# Patient Record
Sex: Female | Born: 1990 | Hispanic: Yes | Marital: Single | Smoking: Never smoker
Health system: Southern US, Community
[De-identification: ages and names within clinical notes are randomized; demographics above are authoritative.]

## PROBLEM LIST (undated history)

## (undated) DIAGNOSIS — D693 Immune thrombocytopenic purpura: Secondary | ICD-10-CM

## (undated) DIAGNOSIS — D649 Anemia, unspecified: Secondary | ICD-10-CM

## (undated) HISTORY — DX: Immune thrombocytopenic purpura: D69.3

## (undated) HISTORY — PX: SPLENECTOMY, TOTAL: SHX788

## (undated) HISTORY — DX: Anemia, unspecified: D64.9

---
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
## 2012-04-09 ENCOUNTER — Emergency Department: Payer: BC Managed Care – PPO

## 2012-04-09 ENCOUNTER — Emergency Department
Admission: EM | Admit: 2012-04-09 | Discharge: 2012-04-09 | Disposition: A | Discharge status: Home / Self Care | Payer: BC Managed Care – PPO | Attending: Emergency Medicine | Admitting: Emergency Medicine

## 2012-04-09 DIAGNOSIS — Z862 Personal history of diseases of the blood and blood-forming organs and certain disorders involving the immune mechanism: Secondary | ICD-10-CM | POA: Insufficient documentation

## 2012-04-09 DIAGNOSIS — J020 Streptococcal pharyngitis: Secondary | ICD-10-CM

## 2012-04-09 LAB — GROUP A STREP, RAPID ANTIGEN: Group A Strep, Rapid Antigen: POSITIVE — AB

## 2012-04-09 MED ORDER — DEXAMETHASONE SODIUM PHOSPHATE 4 MG/ML IJ SOLN
10.00 mg | Freq: Once | INTRAMUSCULAR | Status: DC
Start: 2012-04-09 — End: 2012-04-09

## 2012-04-09 MED ORDER — DEXAMETHASONE SODIUM PHOSPHATE 4 MG/ML IJ SOLN
10.00 mg | Freq: Once | INTRAMUSCULAR | Status: AC
Start: 2012-04-09 — End: 2012-04-09
  Administered 2012-04-09: 10 mg via INTRAMUSCULAR

## 2012-04-09 MED ORDER — DEXAMETHASONE SODIUM PHOSPHATE 10 MG/ML IJ SOLN
INTRAMUSCULAR | Status: DC
Start: 2012-04-09 — End: 2012-04-09
  Filled 2012-04-09: qty 1

## 2012-04-09 MED ORDER — PENICILLIN G BENZATHINE 1200000 UNIT/2ML IM SUSP
1.20 10*6.[IU] | Freq: Once | INTRAMUSCULAR | Status: AC
Start: 2012-04-09 — End: 2012-04-09
  Administered 2012-04-09: 1.2 10*6.[IU] via INTRAMUSCULAR
  Filled 2012-04-09: qty 2

## 2012-04-09 MED ORDER — LIDOCAINE HCL 1 % IJ SOLN
INTRAMUSCULAR | Status: DC
Start: 2012-04-09 — End: 2012-04-09
  Filled 2012-04-09: qty 1

## 2012-04-09 NOTE — ED Notes (Addendum)
Pt arrives ambulatory through triage via private vehicle w/ c/o sore throat x 2 days. Pt states yesterday PM began having chills/fever, last took tylenol and motrin together @ 0900, afebrile in triage. Pt states pain in throat is worse w/ swallowing/talking. Pt states is able to tolerate PO food/fluids but "it hurts to swallow". Pt states was baby-sitting nieces 5 days ago, states nieces recently diagnosed w/ scarlet fever and strep throat. Strep swab obtained in triage and sent to lab.

## 2012-04-09 NOTE — ED Notes (Signed)
Pt tolerated IM medication well, reassessed 15 mins post-administration, no s/s of med reaction, VSS. Pt condition favorable for discharge, resp remain even & unlabored, ambulatory w/ steady gait, in NAD. Verbal and written discharge instructions reviewed w/ pt. Patient informed of signs and symptoms that warrant seeking further evaluation/treatment. Pt states has no further questions. Pt father present at time of dispo.

## 2012-04-09 NOTE — ED Provider Notes (Signed)
Physician/Midlevel provider first contact with patient: 04/09/12 1006         c/o sore throat x 2 days. Pt states yesterday PM began having chills/fever, last took tylenol and motrin together @ 0900, afebrile in triage. Pt states pain in throat is worse w/ swallowing/talking. Pt states is able to tolerate PO food/fluids but "it hurts to swallow". Pt states was baby-sitting nieces 5 days ago, states nieces recently diagnosed w/ scarlet fever and strep throat .    History     Chief Complaint   Patient presents with   . Sore Throat     Patient is a 21 y.o. female presenting with pharyngitis. The history is provided by the patient.   Sore Throat  This is a new problem. The current episode started yesterday. Associated symptoms include abdominal pain, a fever, nausea and vomiting. Pertinent negatives include no arthralgias, chest pain, chills, congestion, coughing, headaches, myalgias, neck pain, numbness, rash, sore throat or weakness. Nothing aggravates the symptoms. She has tried nothing for the symptoms.       Past Medical History   Diagnosis Date   . Anemia      low platelet count r/t splenectomy       Past Surgical History   Procedure Date   . Splenectomy, total        No family history on file.    Social  History   Substance Use Topics   . Smoking status: Never Smoker    . Smokeless tobacco: Not on file   . Alcohol Use: No       .     No Known Allergies    Current/Home Medications    PENICILLIN V POTASSIUM PO    Take by mouth.        Review of Systems   Constitutional: Positive for fever. Negative for chills.   HENT: Negative for ear pain, congestion, sore throat, neck pain, dental problem and voice change.    Eyes: Negative for pain, discharge and visual disturbance.   Respiratory: Negative for cough, chest tightness and shortness of breath.    Cardiovascular: Negative for chest pain and palpitations.   Gastrointestinal: Positive for nausea, vomiting and abdominal pain. Negative for diarrhea.   Genitourinary:  Negative for frequency, flank pain and difficulty urinating.   Musculoskeletal: Negative for myalgias, back pain and arthralgias.   Skin: Negative for rash.   Neurological: Negative for dizziness, weakness, numbness and headaches.   Psychiatric/Behavioral: Negative for suicidal ideas, hallucinations, confusion, self-injury and agitation.       Physical Exam    BP 112/53  Pulse 60  Temp 98.1 F (36.7 C) (Oral)  Resp 18  Ht 1.626 m  Wt 49.896 kg  BMI 18.88 kg/m2  SpO2 98%    Physical Exam   Constitutional: She is oriented to person, place, and time. She appears well-developed and well-nourished. No distress.   HENT:   Head: Atraumatic.   Right Ear: External ear normal.   Left Ear: External ear normal.   Nose: Nose normal.   Mouth/Throat:       Eyes: EOM are normal. Pupils are equal, round, and reactive to light.   Neck: Neck supple. No tracheal deviation present. No thyromegaly present.   Cardiovascular: Normal rate, regular rhythm and normal heart sounds.    No murmur heard.  Pulmonary/Chest: No respiratory distress. She has no wheezes. She has no rales.   Abdominal: Soft. She exhibits no distension and no mass. There is no tenderness.  Musculoskeletal: Normal range of motion. She exhibits no edema and no tenderness.   Neurological: She is alert and oriented to person, place, and time. No cranial nerve deficit.   Skin: Skin is warm. No rash noted. No erythema.   Psychiatric: She has a normal mood and affect. Judgment normal.       MDM and ED Course     ED Medication Orders      Start     Status Ordering Provider    04/09/12 1130      Once,   Status:  Discontinued      Route: Other  Ordered Dose: 10 mg         Discontinued Aura Bibby    04/09/12 1130   penicillin G benzathine (BICILLIN-LA) injection 1.2 Million Units   Once      Route: Intramuscular  Ordered Dose: 1.2 Million Units         Last MAR action:  Given Saprina Chuong    04/09/12 1130   dexamethasone (DECADRON) injection 10 mg   Once      Route:  Intramuscular  Ordered Dose: 10 mg         Last MAR action:  Given Mana Morison    04/09/12 1115      Once,   Status:  Discontinued      Route: Intramuscular  Ordered Dose: 10 mg         Discontinued Steel Kerney    04/09/12 1107     Status:  Discontinued      Comments: Created by cabinet override        Discontinued Romyn Boswell    04/09/12 1100     Status:  Discontinued      Comments: Created by cabinet override        Discontinued Peyton Bottoms, Dwight Adamczak                 MDM      Procedures    Clinical Impression & Disposition     Clinical Impression  Final diagnoses:   Strep pharyngitis        ED Disposition     Discharge Rafaela H Trumpower discharge to home/self care.    Condition at discharge: Stable             New Prescriptions    No medications on file           Labs Reviewed   GROUP A STREP, RAPID ANTIGEN - Abnormal; Notable for the following:     Group A Strep, Rapid Antigen Positive (*)      All other components within normal limits   RAPID INFLUENZA A/B ANTIGENS    Narrative:     ORDER#: [id number]                                    ORDERED BY: Peyton Bottoms, Kamdin Follett  SOURCE: Nasal Aspirate                               COLLECTED:  04/09/12 10:36  ANTIBIOTICS AT COLL.:                                RECEIVED :  04/09/12 10:44  Influenza Rapid Antigen A&B  FINAL       04/09/12 11:02  04/09/12   Negative for Influenza A and B             Reference Range: Negative         Diagnostic Study Results     Labs -     Results     Procedure Component Value Units Date/Time    Rapid Influenza A/B Antigens [[id number]] Collected:04/09/12 1036    Specimen Information:Nasopharyngeal / Nasal Aspirate Updated:04/09/12 1102    Narrative:    ORDER#: [id number]                                    ORDERED BY: Peyton Bottoms, Keiri Solano  SOURCE: Nasal Aspirate                               COLLECTED:  04/09/12 10:36  ANTIBIOTICS AT COLL.:                                RECEIVED :  04/09/12 10:44  Influenza Rapid Antigen A&B                FINAL        04/09/12 11:02  04/09/12   Negative for Influenza A and B             Reference Range: Negative      Rapid Strep [[id number]]  (Abnormal) Collected:04/09/12 1025    Specimen Information:Throat Updated:04/09/12 1041     Group A Strep, Rapid Antigen Positive (A)           Radiologic Studies -   Radiology Results (24 Hour)     ** No Results found for the last 24 hours. **      .    Clinical Course in the Emergency Department            Medical Decision Making     Presumptive Diagnosis: strep pharyngitis    Treatment Plan: Abx therapy, referral to Infectious disease specialist   _______________________________  I am the first provider for this patient.  I reviewed the vital signs, nursing notes, past medical history, past surgical history, family history and social history.  Vital Signs - Patient Vitals for the past 12 hrs:   BP Temp Pulse Resp   04/09/12 1129 112/53 mmHg 98.1 F (36.7 C) 60  18    04/09/12 1017 113/61 mmHg 98.3 F (36.8 C) 79  18      Pulse Oximetry Analysis - Normal  Differential Diagnosis (not completely inclusive): strep. Viral URI,   Laboratory results reviewed by EDP: Yes       Re-evaluation: The child is tolerating po with no vomiting or diarrhea. The child is clinically improved and will be discharged to home for symptomatic care. Return precautions given. Patient to follow up with their pediatrician as instructed.   Bubba Camp, MD  11:34 AM    Return precautions given.   Due to asplenia, I recommended that she increase her usual dosage of daily penicillin (1000 mg) to 1500 mg and follow up with her pediatrician and infectious disease specialist . I gave her the PCN injection in the ED       Bubba Camp, MD  04/09/12 1136

## 2012-04-09 NOTE — ED Notes (Signed)
Pt AA&Ox4, answering questions appropriately, able to speak in complete sentences, speech clear, behavior appropriate for age, interacting w/ parent/caretaker and RN, resp even & unlabored, no accessory muscle use noted, MAE w/ full ROM, ambulatory w/ steady gait, in NAD. Pt reports tolerating PO fluids well at home.  Bed in lowest position, call bell w/i reach and explained to pt, room/floor free of debris/clutter. Pt denies further needs at this time. Will continue to monitor.

## 2012-06-16 NOTE — Discharge Summary (Unsigned)
DATE OF BIRTH:                        Nov 07, 1990            ADMISSION DATE:                     03/16/2003      DISCHARGE DATE:                     03/19/2003            ATTENDING PHYSICIAN:                  Lorrene Reid, MD            ADMITTING SURGEON:  Dr. Tonye Becket.            HISTORY:  The patient is a 21 year old female who had a laparoscopic      splenectomy done a couple of days ago.  She presented with a history of      abdominal pain, nausea and vomiting.  A diagnosis of pancreatitis was made      based on her lab findings.            HOSPITAL COURSE:   She was started on clear liquids and was also placed on      pain medication.  A couple of days into admission she was started on a      clear liquid diet, which she did well with.  The pain was adequately      well-controlled.  Abdomen was very tender when she presented and now is      much better.  CT scan done on admission showed evidence of postoperative      changes in the splenic bed, but there was no evidence of active bleeding,      no evidence of pus collection.            The patient was advanced to a regular diet.  She is being discharged home      today, 03/22/2003 with medications for pain and instructions on diet with      emphasis on low fat diet.  She will get a follow up appointment to see [place]      in the clinic in the next 2 weeks.                                    ___________________________________     Date Signed: __________      Lorrene Reid, MD  ([id number])            D: 03/22/2003 by Carney Corners, MD      T: 03/22/2003 by [id number] ([id number]) Dorris Carnes: [id number])      cc:  Lorrene Reid, MD

## 2012-06-16 NOTE — Discharge Summary (Unsigned)
DATE OF BIRTH:                        1991-01-06            ADMISSION DATE:                     03/10/2003      DISCHARGE DATE:                     03/12/2003            ATTENDING PHYSICIAN:                  Lorrene Reid, MD            HOSPITAL COURSE:   Grace Mccarthy is a 21 year old female with a history of      idiopathic thrombocytopenic purpura. She had a laparoscopic splenectomy on      03/10/2003.  The procedure went well.  The patient was transferred up to      the floor and started on antibiotics.  She tolerated a clear liquid diet.      She was advanced to a regular diet, which she tolerated well.  She      ambulated out of bed.  The wounds looked clean and dry.  The pain was      adequately well controlled.  She is being discharged home today,      03/12/2003, with medications for pain and antibiotics with a followup to      see [place] in the next 2 weeks, instructions on exercise and instructions on      wound care.                                    ___________________________________          Date Signed: __________      Lorrene Reid, MD  ([id number])            D: 03/12/2003 by Carney Corners, MD      T: 03/13/2003 by [id number] ([id number]) Dorris Carnes: [id number])      cc:  Lorrene Reid, MD

## 2012-06-16 NOTE — Op Note (Unsigned)
DATE OF BIRTH:                        May 09, 1991      ADMISSION DATE:                     03/10/2003            PATIENT LOCATION:                    [facility] 03/12/2003            DATE OF PROCEDURE:                   03/10/2003      SURGEON:                            Lorrene Reid, MD      ASSISTANT(S):                         Carney Corners, MD                  PREOPERATIVE DIAGNOSIS:  IMMUNE THROMBOCYTOPENIC PURPURA.            POSTOPERATIVE DIAGNOSIS:  IMMUNE THROMBOCYTOPENIC PURPURA.            PROCEDURE:  LAPAROSCOPIC SPLENECTOMY.            ANESTHESIA:  General.            INDICATIONS FOR PROCEDURE:  She is a 21 year old girl with ITP who has      failed medical management and now requires a splenectomy for hypersplenism.      Her family understands the rationale for laparoscopic splenectomy as well      as the risks, benefits and alternatives to the procedure, but they are      eager to proceed..            DESCRIPTION OF PROCEDURE:  She was taken to the operating room and placed      in supine position.  After induction of adequate general endotracheal      anesthesia and after administration of intravenous antibiotics, she was      placed in the left anterior oblique position with appropriate padding.  Her      abdomen and left flank were prepped and draped in the usual sterile      fashion.  A 12-mm laparoscopic port was inserted under direct visualization      at the umbilicus.  Pneumoperitoneum was established and 2 further 5-mm      ports were placed in the upper abdominal midline and left flank.      Instruments inserted through these ports were used to pull away the omentum      from the left upper quadrant.  She was noted to have a small accessory      spleen in the gastrosplenic ligament and this was removed using Harmonic      scalpel and sent for pathologic examination.  The Harmonic scalpel was also      used to dissect free multiple lateral and inferior attachments to the      spleen.  The  short gastric vessels were taken down in a similar fashion.      One short gastric vessel developed some bleeding but this was easily      controlled  using Harmonic scalpel.  The splenic artery and vein were easily      identified and were separately transected using the Endo GIA stapler.  The      devascularized spleen was then placed into an endo catch bag and brought to      the anterior abdominal wall at the umbilicus.  The spleen was morcellated      within the bag and removed via the umbilical port.  The spleen was sent for      pathologic examination.  The endo catch bag was removed and      pneumoperitoneum was reestablished.  A small amount of free blood was      suctioned clear and the peritoneum was irrigated with copious amounts of      warm normal saline and this was also suctioned clear.  Hemostasis was      excellent.  The ports were removed under direct visualization as      pneumoperitoneum was released.  Marcaine 0.25% with epinephrine was      infiltrated locally and the wounds were irrigated with warm normal saline      and closed in layers of 2-0 Vicryl, 4-0 Vicryl, Mastisol and Steri-Strips      and a dry sterile dressing.  She tolerated the procedure well and was taken      from the operating room to the recovery room in good condition.      There were no immediate complications of the procedure.  Sponge, needle and      instrument counts were reported as correct x2.                                    ___________________________________          Date Signed: __________      Lorrene Reid, MD  ([id number])            D: 04/05/2003 by Lorrene Reid, MD      T: 04/07/2003 by [id number] ([id number]) ([id number])      cc:  Lorrene Reid, MD

## 2013-05-11 ENCOUNTER — Inpatient Hospital Stay
Admission: EM | Admit: 2013-05-11 | Discharge: 2013-05-14 | DRG: 813 | Disposition: A | Discharge status: Home / Self Care | Payer: BC Managed Care – PPO | Attending: Nephrology | Admitting: Nephrology

## 2013-05-11 ENCOUNTER — Inpatient Hospital Stay: Payer: BC Managed Care – PPO | Admitting: Internal Medicine

## 2013-05-11 DIAGNOSIS — J329 Chronic sinusitis, unspecified: Secondary | ICD-10-CM | POA: Diagnosis present

## 2013-05-11 DIAGNOSIS — Z9089 Acquired absence of other organs: Secondary | ICD-10-CM

## 2013-05-11 DIAGNOSIS — D693 Immune thrombocytopenic purpura: Principal | ICD-10-CM | POA: Diagnosis present

## 2013-05-11 DIAGNOSIS — J029 Acute pharyngitis, unspecified: Secondary | ICD-10-CM | POA: Diagnosis present

## 2013-05-11 DIAGNOSIS — D649 Anemia, unspecified: Secondary | ICD-10-CM | POA: Diagnosis present

## 2013-05-11 LAB — CBC PATHOLOGIST REVIEW

## 2013-05-11 LAB — CBC AND DIFFERENTIAL
Basophils Absolute Automated: 0.04 (ref 0.00–0.20)
Basophils Automated: 0 %
Eosinophils Absolute Automated: 0.06 (ref 0.00–0.70)
Eosinophils Automated: 0 %
Hematocrit: 39.1 % (ref 37.0–47.0)
Hgb: 13.3 g/dL (ref 12.0–16.0)
Immature Granulocytes Absolute: 0.04
Immature Granulocytes: 0 %
Lymphocytes Absolute Automated: 2.54 (ref 0.50–4.40)
Lymphocytes Automated: 20 %
MCH: 30.4 pg (ref 28.0–32.0)
MCHC: 34 g/dL (ref 32.0–36.0)
MCV: 89.5 fL (ref 80.0–100.0)
Monocytes Absolute Automated: 1.29 — ABNORMAL HIGH (ref 0.00–1.20)
Monocytes: 10 %
Neutrophils Absolute: 8.46 — ABNORMAL HIGH (ref 1.80–8.10)
Neutrophils: 68 %
Nucleated RBC: 0 (ref 0–1)
Platelets: 21 — ABNORMAL LOW (ref 140–400)
RBC: 4.37 (ref 4.20–5.40)
RDW: 13 % (ref 12–15)
WBC: 12.39 — ABNORMAL HIGH (ref 3.50–10.80)

## 2013-05-11 LAB — COMPREHENSIVE METABOLIC PANEL
ALT: 17 U/L (ref 0–55)
AST (SGOT): 22 U/L (ref 5–34)
Albumin/Globulin Ratio: 0.8 — ABNORMAL LOW (ref 0.9–2.2)
Albumin: 3.3 g/dL — ABNORMAL LOW (ref 3.5–5.0)
Alkaline Phosphatase: 48 U/L (ref 40–150)
Anion Gap: 13 (ref 5.0–15.0)
BUN: 5 mg/dL — ABNORMAL LOW (ref 7.0–19.0)
Bilirubin, Total: 0.3 mg/dL (ref 0.2–1.2)
CO2: 24 mEq/L (ref 22–29)
Calcium: 8.4 mg/dL — ABNORMAL LOW (ref 8.5–10.5)
Chloride: 102 mEq/L (ref 98–107)
Creatinine: 0.8 mg/dL (ref 0.6–1.0)
Globulin: 4.3 g/dL — ABNORMAL HIGH (ref 2.0–3.6)
Glucose: 82 mg/dL (ref 70–100)
Potassium: 3.8 mEq/L (ref 3.5–5.1)
Protein, Total: 7.6 g/dL (ref 6.0–8.3)
Sodium: 139 mEq/L (ref 136–145)

## 2013-05-11 LAB — LACTIC ACID, PLASMA: Lactic Acid: 0.9 mEq/L (ref 0.5–2.2)

## 2013-05-11 LAB — POCT RAPID STREP A: Rapid Strep A Screen POCT: NEGATIVE

## 2013-05-11 LAB — MONONUCLEOSIS SCREEN: Mono Screen: NEGATIVE

## 2013-05-11 LAB — GFR: EGFR: >60

## 2013-05-11 LAB — HEMOLYSIS INDEX: Hemolysis Index: 0 (ref 0–18)

## 2013-05-11 MED ORDER — INFLUENZA VAC SPLIT QUAD 0.5 ML IM SUSP
0.5000 mL | INTRAMUSCULAR | Status: AC | PRN
Start: 2013-05-11 — End: 2013-05-14
  Administered 2013-05-14: 0.5 mL via INTRAMUSCULAR
  Filled 2013-05-11: qty 0.5

## 2013-05-11 MED ORDER — ACETAMINOPHEN 325 MG PO TABS
650.0000 mg | ORAL_TABLET | ORAL | Status: AC | PRN
Start: 2013-05-11 — End: 2013-05-11
  Administered 2013-05-11: 650 mg via ORAL
  Filled 2013-05-11: qty 2

## 2013-05-11 MED ORDER — DIPHENHYDRAMINE HCL 50 MG/ML IJ SOLN
6.2500 mg | INTRAMUSCULAR | Status: AC | PRN
Start: 2013-05-11 — End: 2013-05-12
  Administered 2013-05-12: 6.5 mg via INTRAVENOUS
  Filled 2013-05-11: qty 1

## 2013-05-11 MED ORDER — ACETAMINOPHEN 325 MG PO TABS
650.0000 mg | ORAL_TABLET | ORAL | Status: DC | PRN
Start: 2013-05-11 — End: 2013-05-14
  Administered 2013-05-14: 650 mg via ORAL
  Filled 2013-05-11: qty 2

## 2013-05-11 MED ORDER — SODIUM CHLORIDE 0.9 % IV BOLUS
1000.0000 mL | Freq: Once | INTRAVENOUS | Status: AC
Start: 2013-05-11 — End: 2013-05-11
  Administered 2013-05-11: 1000 mL via INTRAVENOUS

## 2013-05-11 MED ORDER — SODIUM CHLORIDE 0.9 % IV MBP
1.0000 g | INTRAVENOUS | Status: DC
Start: 2013-05-11 — End: 2013-05-14
  Administered 2013-05-12 – 2013-05-14 (×3): 1 g via INTRAVENOUS
  Filled 2013-05-11 (×4): qty 1000

## 2013-05-11 MED ORDER — DIPHENHYDRAMINE HCL 25 MG PO CAPS
25.0000 mg | ORAL_CAPSULE | ORAL | Status: AC | PRN
Start: 2013-05-11 — End: 2013-05-11
  Administered 2013-05-11: 25 mg via ORAL
  Filled 2013-05-11: qty 1

## 2013-05-11 MED ORDER — SODIUM CHLORIDE 0.9 % IV SOLN
INTRAVENOUS | Status: DC
Start: 2013-05-11 — End: 2013-05-13

## 2013-05-11 MED ORDER — IMMUNE GLOBULIN (HUMAN) 100 MG/ML IJ/IV SOLN (WRAP)
1.0000 g/kg | Status: AC
Start: 2013-05-11 — End: 2013-05-12
  Administered 2013-05-11 – 2013-05-12 (×2): 50 g via INTRAVENOUS
  Filled 2013-05-11 (×2): qty 500

## 2013-05-11 MED ORDER — CEFTRIAXONE SODIUM 1 G IJ SOLR
1.0000 g | Freq: Once | INTRAMUSCULAR | Status: AC
Start: 2013-05-11 — End: 2013-05-11
  Administered 2013-05-11: 1 g via INTRAVENOUS
  Filled 2013-05-11: qty 1000

## 2013-05-11 MED ORDER — METHYLPREDNISOLONE SODIUM SUCC 125 MG IJ SOLR
50.0000 mg | Freq: Once | INTRAMUSCULAR | Status: AC
Start: 2013-05-11 — End: 2013-05-11
  Administered 2013-05-11: 50 mg via INTRAVENOUS
  Filled 2013-05-11: qty 2

## 2013-05-11 NOTE — ED Provider Notes (Signed)
EMERGENCY DEPARTMENT HISTORY AND PHYSICAL EXAM     Physician/Midlevel provider first contact with patient: 05/11/13 1250         Date: 05/11/2013  Patient Name: Grace Mccarthy    History of Presenting Illness     Chief Complaint   Patient presents with   . Sore Throat       History Provided By: Patient     Chief Complaint: congestion , sore throat  Onset: x 5 days ago   Timing: worsening   Location: throat , sinuses  Quality: congestion  Severity: moderate   Modifying Factors: has been taking Tylenol, Aleve, NyQuil w/ no relief; has increased her dose of Penicillin w/ no relief; drinking plenty of fluids w/ no relief   Associated Symptoms: difficulty breathing (from nasal congestion), sore throat, fever, cough, increased mucous production, headache     Additional History: Grace Mccarthy is a 22 y.o. female c/o worsening congestion x 5 days ago w/ associated cough (mixed w/ saliva and streaks of  blood; mostly at night), sore throat, headache, increased mucous production, difficulty breathing and difficulty hearing from R ear (both from congestion) and fever (highest at 101.77F this morning). Pt sts she has taken Tylenol (last dose at 7am today), Aleve, and NyQuil w/ no relief. 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 She's has also been drinking plenty of fluids but still feels dehydrated. Pt reports increased hematoma on her tongue( chronic) and also noticed some bruising and pain on her bilateral legs x 2 days ago. Pt's father reports pt has also been having nosebleeds. She denies N/V, but reports belching. She denies dysuria, smoking, EtOH or drug use, or getting the flu shot this year. Pt's LNMP: 2 wks ago     PCP: Lenard Simmer, MD      Current Facility-Administered Medications   Medication Dose Route  Frequency Provider Last Rate Last Dose   . [COMPLETED] acetaminophen (TYLENOL) tablet 650 mg  650 mg Oral PRN Azzie Glatter, MD   650 mg at 05/11/13 1654   . [COMPLETED] cefTRIAXone (ROCEPHIN) injection 1 g  1 g Intravenous Once Azzie Glatter, MD   1 g at 05/11/13 1327   . [COMPLETED] diphenhydrAMINE (BENADRYL) capsule 25 mg  25 mg Oral PRN Azzie Glatter, MD   25 mg at 05/11/13 1654   . diphenhydrAMINE (BENADRYL) injection 6.5 mg  6.5 mg Intravenous PRN Azzie Glatter, MD       . immune globulin (human) (IVIG) infusion 50 g  1 g/kg (Adjusted) Intravenous Q24H Azzie Glatter, MD       . [address] methylprednisolone sodium succinate (Solu-MEDROL) injection 50 mg  50 mg Intravenous Once Azzie Glatter, MD   50 mg at 05/11/13 1619   . [COMPLETED] sodium chloride 0.9 % bolus 1,000 mL  1,000 mL Intravenous Once Azzie Glatter, MD   1,000 mL at 05/11/13 1358       Past History     Past Medical History:  Past Medical History   Diagnosis Date   . Anemia      low platelet count r/t splenectomy   . ITP (idiopathic thrombocytopenic purpura)        Past Surgical History:  Past  Surgical History   Procedure Date   . Splenectomy, total        Family History:  History reviewed. No pertinent family history.    Social History:  History   Substance Use Topics   . Smoking status: Never Smoker    . Smokeless tobacco: Not on file   . Alcohol Use: No       Allergies:  No Known Allergies    Review of Systems     Review of Systems   Constitutional: Positive for fever (highest temp at 101.68F this morning). Negative for diaphoresis.   HENT: Positive for congestion, nosebleeds and sore throat.         + increased mucous production  + decreased hearing from R ear (due to congestion)    Eyes: Negative for discharge and redness.   Respiratory: Positive for cough. Shortness of breath: associated with the congestion.         + difficulty breathing (due to congestion)    Cardiovascular: Negative for leg swelling.   Gastrointestinal: Negative for  vomiting.   Genitourinary: Negative for dysuria.   Skin: Negative for rash.   Neurological: Positive for headaches. Negative for tremors.   Endo/Heme/Allergies: Bruises/bleeds easily (tongue / bilateral legs).   Psychiatric/Behavioral: Negative for substance abuse.         Physical Exam   BP 108/60  Pulse 90  Temp 99.1 F (37.3 C) (Oral)  Resp 16  Ht 1.651 m  Wt 49.896 kg  BMI 18.31 kg/m2  SpO2 96%  Physical Exam   Nursing note and vitals reviewed.  Constitutional: She is oriented to person, place, and time. She appears well-developed and well-nourished. She appears distressed.   HENT:   Head: Normocephalic and atraumatic.   Right Ear: External ear normal.   Left Ear: External ear normal.   Mouth/Throat: No oropharyngeal exudate.        Injected posterior pharynx, no abscess or exudates  Pt has swelling to the right side of her tongue which is very soft and compressible.    Eyes: Conjunctivae normal are normal. Right eye exhibits no discharge. Left eye exhibits no discharge. No scleral icterus.   Neck: Normal range of motion. Neck supple.   Cardiovascular: Normal rate, regular rhythm and normal heart sounds.    Pulmonary/Chest: Effort normal and breath sounds normal. No stridor. No respiratory distress. She has no wheezes. She has no rales.   Abdominal: Soft. Bowel sounds are normal. She exhibits no distension. There is no tenderness. There is no rebound and no guarding.   Musculoskeletal: She exhibits no edema and no tenderness.   Neurological: She is alert and oriented to person, place, and time.   Skin: Skin is warm and dry. She is not diaphoretic.        + multiple bruises on both legs   Psychiatric: She has a normal mood and affect. Judgment normal.         Diagnostic Study Results     Labs -     Results     Procedure Component Value Units Date/Time    Blood Culture #1 [[id number]] Collected:05/11/13 1345    Specimen Information:Blood / Blood Updated:05/11/13 1534    Narrative:    8ml required    Blood  Culture #2 [[id number]] Collected:05/11/13 1345    Specimen Information:Blood / Blood Updated:05/11/13 1534    Narrative:    8ml required    CBC Pathologist Review [[id number]] Collected:05/11/13 1345     CBC Pathologist Review See  Note Updated:05/11/13 1435    CBC and differential [[id number]]  (Abnormal) Collected:05/11/13 1345    Specimen Information:Blood / Blood Updated:05/11/13 1419     WBC 12.39 (H)      RBC 4.37      Hgb 13.3 g/dL      Hematocrit 54.0 %      MCV 89.5 fL      MCH 30.4 pg      MCHC 34.0 g/dL      RDW 13 %      Platelets 21 (L)      MPV Unmeasured fL      Neutrophils 68 %      Lymphocytes Automated 20 %      Monocytes 10 %      Eosinophils Automated 0 %      Basophils Automated 0 %      Immature Granulocyte 0 %      Nucleated RBC 0      Neutrophils Absolute 8.46 (H)      Abs Lymph Automated 2.54      Abs Mono Automated 1.29 (H)      Abs Eos Automated 0.06      Absolute Baso Automated 0.04      Absolute Immature Granulocyte 0.04     Influenza A/B Virus Antigen [[id number]] Collected:05/11/13 1345    Specimen Information:Nasopharyngeal / Nasal Aspirate Updated:05/11/13 1417    Narrative:    ORDER#: [id number]                                    ORDERED BY: Avanell Shackleton  SOURCE: Nasal Aspirate                               COLLECTED:  05/11/13 13:45  ANTIBIOTICS AT COLL.:                                RECEIVED :  05/11/13 13:54  Influenza Rapid Antigen A&B                FINAL       05/11/13 14:17  05/11/13   Negative for Influenza A and B             Reference Range: Negative      Mononucleosis Screen [[id number]] Collected:05/11/13 1345    Specimen Information:Blood Updated:05/11/13 1416     Mono Screen Negative     Comprehensive Metabolic Panel (CMP) [[id number]]  (Abnormal) Collected:05/11/13 1345    Specimen Information:Blood Updated:05/11/13 1415     Glucose 82 mg/dL      BUN 5.0 (L) mg/dL      Creatinine 0.8 mg/dL      Sodium 324 mEq/L      Potassium 3.8 mEq/L      Chloride 102 mEq/L      CO2 24 mEq/L       CALCIUM 8.4 (L) mg/dL      Protein, Total 7.6 g/dL      Albumin 3.3 (L) g/dL      AST (SGOT) 22 U/L      ALT 17 U/L      Alkaline Phosphatase 48 U/L      Bilirubin, Total 0.3 mg/dL      Globulin 4.3 (H) g/dL      Albumin/Globulin Ratio 0.8 (L)  Anion Gap 13.0     Hemolysis index [[id number]] Collected:05/11/13 1345     Hemolysis Index 0 Updated:05/11/13 1415    GFR [[id number]] Collected:05/11/13 1345     EGFR >60.0 Updated:05/11/13 1415    Rapid Group A Strep POC [[id number]] Collected:05/11/13 1402    Specimen Information:Throat Updated:05/11/13 1405     POCT QC Pass      Rapid Strep A Screen POCT Negative       Comment        Result:     Negative Results should be confirmed by throat Cx to confirm absence of Strep A inf.    Lactic Acid [[id number]] Collected:05/11/13 1344    Specimen Information:Blood Updated:05/11/13 1344     Lactic acid 0.9 mEq/L                 Medical Decision Making   I am the first provider for this patient.    I reviewed the vital signs, available nursing notes, past medical history, past surgical history, family history and social history.    Vital Signs-Reviewed the patient's vital signs.     Patient Vitals for the past 12 hrs:   BP Temp Pulse Resp   05/11/13 1804 108/60 mmHg 99.1 F (37.3 C) 90  16    05/11/13 1602 100/53 mmHg 101.5 F (38.6 C) 68  16    05/11/13 1234 98/51 mmHg 99.4 F (37.4 C) 105  22        Pulse Oximetry Analysis - Normal 100% on RA    Old Medical Records: Nursing notes.     ED Course:   2:21 PM - Paged Dr. Lorel Monaco, internal medicine on-call     3:22 PM - Checked on pt; updated pt and pt's father on lab results; informed them we are waiting for Dr. Lorel Monaco to call us back.     3:27 PM - Discussed pt case w/ Dr. Lorel Monaco; will admit pt; wants hematology consult.     3:34 PM - Paged hematology on-call.     3:56 PM - Discussed pt case w/ Dr. Hulan Saas, hematology on-call; sts to give pt  50mg  of Solumedrol and IV IG.     4:50 PM - Updated pt and pt's father on tx plan  about admission; agree w/ plan.     6:42 PM - Discussed pt case w/ Dr. Lorel Monaco, internal medicine, updated him on conversation w/ Dr. Hulan Saas.     Provider Notes:     Diagnosis     Clinical Impression:   1. Pharyngitis    2. Thrombocytopenia        _______________________________    Attestations:  This note is prepared by Rickey Primus, acting as Scribe for Dr. Benjaman Kindler, MD.     Dr. Benjaman Kindler, MD. The scribe's documentation has been prepared under my direction and personally reviewed by me in its entirety.  I confirm that the note above accurately reflects all work, treatment, procedures, and medical decision making performed by me.    _______________________________          Azzie Glatter, MD  05/11/13 2055

## 2013-05-11 NOTE — Treatment Plan (Signed)
VTE/PE Risk Screening  Complete Upon Admission and Transfer to Different Level of Care  Completed by nurse: Garey Ham 05/11/2013 7:41 PM   -----------------------------------------------------------------------------------------------------------  SECTION 1 - Risk Screening     []   Patient currently receiving anticoagulation therapy (Heparin, Lovenox, Coumadin, Pradaxa, Xarelto, or Arixtra Only) and received 1 dose within 24 hours of admission STOP HERE   []   VTE/PE prophylaxis currently prescribed elsewhere - STOP HERE   []   Comfort Care - STOP HERE   []   Clinical Trials - STOP HERE    Contraindications: Patients with a history of the following conditions cannot haveSequential compression devices (SCD     []  Any of these conditions present , Call MD for pharmacological prophylaxis or ask MD to document reason for not having both mechanical and pharmacologic VTE prophylaxis   []  Post-op vein ligation   []  Suspected VTE   []  Cellulitis/Dermatitis of the leg   []  Severe ischemic Vascular disease   []  Edema related to Congestive Heart Faliure   []  Gangrene   []  Recent skin graft  -----------------------------------------------------------------------------------------------------------  SECTION 2 - Risk Factors (Check all that apply)    Moderate Risk Factors   []   Heart Failure (current or history of)   []   Respiratory Failure   []   Acute Myocardial Infarction (AMI)   [x]   Acute Infection   []   Rheumatologic Disorder   []   Elderly age (22 years old)   []   Ongoing hormonal treatment / estrogen use (including Tamoxifen, Raloxifene)   []   Obesity (BMI >/= 30kg/m2)    High Risk Factors   []   Recent (</= 1 month) trauma/surgery    Highest Risk Factors   []   Active Cancer   []   Previous VTE   []   Reduced mobility (>24 hrs; current or anticipated)   []   Known thrombophilic condition (hematological disorders that promote thrombosis)      []   No boxes checked in this section indicate patient is at low risk for VTE. No VTE  Prophylaxis indicated.      * No surgery found * [x]   One or more risk factors present, enter an EPIC order for Sequential compression devices (SCD). Use per protocol, MD signature required.

## 2013-05-11 NOTE — Plan of Care (Signed)
Problem: Bleeding Precautions  Goal: Free from bleeding  Outcome: Progressing  Pt alert/oriented x4 vss, platelet count 21 on admission, ivig infusing, pt has some mild bruising to ble and large hematoma to R side of tongue that has been present since childhood (per pt). Pt denies any pain, vss, lungs cta, pt has some upper respiratory congestion and occasional throat soreness. SCDs to ble. Pt resting comfortably.

## 2013-05-11 NOTE — ED Notes (Signed)
Pt reports increased mucous production and sore throat X 5 days with temp of 101.2 at home. Pt treated with Tylenol prior to arrival to ED. Pt able to handle oral secretions. Pt reports Nausea, but denies diarreah, SOB, or LOC. Pt reports decreased auditory function to right ear because of suspected congestion. Pt is A&O

## 2013-05-12 ENCOUNTER — Inpatient Hospital Stay: Payer: BC Managed Care – PPO

## 2013-05-12 DIAGNOSIS — J029 Acute pharyngitis, unspecified: Secondary | ICD-10-CM

## 2013-05-12 LAB — COMPREHENSIVE METABOLIC PANEL
ALT: 17 U/L (ref 0–55)
AST (SGOT): 19 U/L (ref 5–34)
Albumin/Globulin Ratio: 0.5 — ABNORMAL LOW (ref 0.9–2.2)
Albumin: 2.6 g/dL — ABNORMAL LOW (ref 3.5–5.0)
Alkaline Phosphatase: 44 U/L (ref 40–150)
Anion Gap: 8 (ref 5.0–15.0)
BUN: 8 mg/dL (ref 7.0–19.0)
Bilirubin, Total: 0.2 mg/dL (ref 0.2–1.2)
CO2: 22 mEq/L (ref 22–29)
Calcium: 8.2 mg/dL — ABNORMAL LOW (ref 8.5–10.5)
Chloride: 106 mEq/L (ref 98–107)
Creatinine: 0.7 mg/dL (ref 0.6–1.0)
Globulin: 5.6 g/dL — ABNORMAL HIGH (ref 2.0–3.6)
Glucose: 185 mg/dL — ABNORMAL HIGH (ref 70–100)
Potassium: 4 mEq/L (ref 3.5–5.1)
Protein, Total: 8.2 g/dL (ref 6.0–8.3)
Sodium: 136 mEq/L (ref 136–145)

## 2013-05-12 LAB — CBC WITH MANUAL DIFFERENTIAL
Band Neutrophils Absolute: 0.13 (ref 0.00–1.00)
Band Neutrophils: 2 %
Basophils Absolute Manual: 0.07 (ref 0.00–0.20)
Basophils Manual: 1 %
Cell Morphology: NORMAL
Eosinophils Absolute Manual: 0 (ref 0.00–0.70)
Eosinophils Manual: 0 %
Hematocrit: 35.3 % — ABNORMAL LOW (ref 37.0–47.0)
Hgb: 11.9 g/dL — ABNORMAL LOW (ref 12.0–16.0)
Lymphocytes Absolute Manual: 1.13 (ref 0.50–4.40)
Lymphocytes Manual: 17 %
MCH: 30.2 pg (ref 28.0–32.0)
MCHC: 33.7 g/dL (ref 32.0–36.0)
MCV: 89.6 fL (ref 80.0–100.0)
Monocytes Absolute: 0.33 (ref 0.00–1.20)
Monocytes Manual: 5 %
Neutrophils Absolute Manual: 4.98 (ref 1.80–8.10)
Nucleated RBC: 0 (ref 0–1)
Platelets: 48 — ABNORMAL LOW (ref 140–400)
RBC: 3.94 — ABNORMAL LOW (ref 4.20–5.40)
RDW: 14 % (ref 12–15)
Segmented Neutrophils: 75 %
WBC: 6.64 (ref 3.50–10.80)

## 2013-05-12 LAB — URINALYSIS WITH MICROSCOPIC
Bilirubin, UA: NEGATIVE
Blood, UA: NEGATIVE
Glucose, UA: 500 — AB
Ketones UA: NEGATIVE
Leukocyte Esterase, UA: NEGATIVE
Nitrite, UA: NEGATIVE
Protein, UR: NEGATIVE
Specific Gravity UA: 1.01 (ref 1.001–1.035)
Urine pH: 7 (ref 5.0–8.0)
Urobilinogen, UA: NEGATIVE mg/dL

## 2013-05-12 LAB — GFR: EGFR: >60

## 2013-05-12 LAB — HEMOLYSIS INDEX: Hemolysis Index: 1 (ref 0–18)

## 2013-05-12 MED ORDER — PSEUDOEPHEDRINE HCL ER 120 MG PO TB12
120.0000 mg | ORAL_TABLET | Freq: Two times a day (BID) | ORAL | Status: DC
Start: 2013-05-12 — End: 2013-05-14
  Administered 2013-05-12 – 2013-05-14 (×4): 120 mg via ORAL
  Filled 2013-05-12 (×4): qty 1

## 2013-05-12 MED ORDER — FLUTICASONE PROPIONATE 50 MCG/ACT NA SUSP
1.0000 | Freq: Every day | NASAL | Status: DC
Start: 2013-05-12 — End: 2013-05-14
  Administered 2013-05-12 – 2013-05-14 (×3): 1 via NASAL
  Filled 2013-05-12: qty 16

## 2013-05-12 MED ORDER — CETIRIZINE HCL 10 MG PO TABS
10.0000 mg | ORAL_TABLET | Freq: Every day | ORAL | Status: DC
Start: 2013-05-12 — End: 2013-05-14
  Administered 2013-05-12 – 2013-05-14 (×3): 10 mg via ORAL
  Filled 2013-05-12 (×3): qty 1

## 2013-05-12 NOTE — Consults (Signed)
CONSULTATION    Date Time: 05/12/2013 11:44 AM  Patient Name: Grace Mccarthy, Grace Mccarthy  Requesting Physician: Tona Sensing, MD      Reason for Consultation:   ITP s/p splenectomy    Assessment:   Has diagnosis of ITP, Good response to IVIG and high dose steroids  Mild anemia    Plan:   Cont current dose of Steroids for now-start slow taper as an outpt   IVIG x 2 days  No need for plt transfusion  Had bone marrow biopsy during childhood  Will discuss Rituxan and thrombopoietin agonists which can be used to maintain plt count  Obtain records from her pediatrician  History:   Grace Mccarthy is a 22 y.o. female who presents to the hospital on 05/11/2013 with sore throat and petechiae and nose bleed. H/o ITP since age 34, she was seeing a hematologist in [place] during childhood. 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 She reports multiple flares of ITP. All occuring in setting of viral infections. Pt reports that her pediatrician Dr Clement Sayres has been managing her disease for the most part. She came in to ER with sore throat, nose bleed, hemoptysis, fever of 101.1F at home. She is being treated for pharyngitis. She has been started on IVIG and steroids. Notes that her petechiae have resolved.     Past Medical History:     Past Medical History   Diagnosis Date   . Anemia      low platelet count r/t splenectomy   . ITP (idiopathic thrombocytopenic purpura)        Past Surgical History:     Past Surgical History   Procedure Date   . Splenectomy, total        Family History:   History reviewed. No pertinent family history.    Social History:     History     Social History   . Marital Status: Single     Spouse Name: N/A     Number of Children: N/A   . Years of Education: N/A     Social History Main Topics   . Smoking status: Never Smoker    . Smokeless tobacco: Not on file   . Alcohol Use: No   . Drug  Use: No   . Sexually Active: Not on file     Other Topics Concern   . Not on file     Social History Narrative   . No narrative on file       Allergies:   No Known Allergies    Medications:     Current Facility-Administered Medications   Medication Dose Route Frequency   . cefTRIAXone  1 g Intravenous Q24H SCH   . [COMPLETED] cefTRIAXone  1 g Intravenous Once   . immune globulin (human)  1 g/kg (Adjusted) Intravenous Q24H   . [COMPLETED] methylprednisolone sodium succinate  50 mg Intravenous Once   . [COMPLETED] sodium chloride  1,000 mL Intravenous Once       Review of Systems:   A comprehensive review of systems was: History obtained from the patient  General ROS: positive for  - fatigue and fever  negative for - night sweats  ENT ROS: positive for - epistaxis, oral lesions, sneezing and sore throat  Hematological and Lymphatic ROS: positive for - bleeding problems  Respiratory ROS: no cough, shortness of breath,  or wheezing  Cardiovascular ROS: no chest pain or dyspnea on exertion  Gastrointestinal ROS: no abdominal pain, change in bowel habits, or black or bloody stools  Musculoskeletal ROS: negative  Neurological ROS: no TIA or stroke symptoms  Dermatological ROS: positive for bruising    Physical Exam:     Filed Vitals:    05/12/13 0803   BP: 105/57   Pulse: 71   Temp: 96.8 F (36 C)   Resp: 16   SpO2: 100%       Intake and Output Summary (Last 24 hours) at Date Time  No intake or output data in the 24 hours ending 05/12/13 1144    General appearance - alert, well appearing, and in no distress  Mental status - alert, oriented to person, place, and time  Mouth - erythematous  Neck - supple, no significant adenopathy  Chest - clear to auscultation, no wheezes, rales or rhonchi, symmetric air entry  Heart - normal rate, regular rhythm, normal S1, S2, no murmurs, rubs, clicks or gallops  Neurological - alert, oriented, normal speech, no focal findings or movement disorder noted  Musculoskeletal - no joint  tenderness, deformity or swelling  Extremities - peripheral pulses normal, no pedal edema, no clubbing or cyanosis  Skin - normal coloration and turgor, no rashes, no suspicious skin lesions noted    Labs Reviewed:     Results     Procedure Component Value Units Date/Time    CBC WITH MANUAL DIFFERENTIAL [[id number]]  (Abnormal) Collected:05/12/13 0436     WBC 6.64 Updated:05/12/13 0732     RBC 3.94 (L)      Hgb 11.9 (L) g/dL      Hematocrit 62.9 (L) %      MCV 89.6 fL      MCH 30.2 pg      MCHC 33.7 g/dL      RDW 14 %      Platelets 48 (L)      MPV Unmeasured fL      Segmented Neutrophils 75 %      Band Neutrophils 2 %      Lymphocytes Manual 17 %      Monocytes Manual 5 %      Eosinophils Manual 0 %      Basophils Manual 1 %      Nucleated RBC 0      Abs Seg Manual 4.98      Bands Absolute 0.13      Absolute Lymph Manual 1.13      Monocytes Absolute 0.33      Absolute Eos Manual 0.00      Absolute Baso Manual 0.07      Cell Morphology: Normal     Comprehensive metabolic panel [[id number]]  (Abnormal) Collected:05/12/13 0436    Specimen Information:Blood Updated:05/12/13 0554     Glucose 185 (H) mg/dL      BUN 8.0 mg/dL      Creatinine 0.7 mg/dL      Sodium 010 mEq/L      Potassium 4.0 mEq/L      Chloride 106 mEq/L      CO2 22 mEq/L      CALCIUM 8.2 (L) mg/dL      Protein, Total 8.2 g/dL      Albumin 2.6 (L) g/dL      AST (SGOT) 19 U/L      ALT 17 U/L      Alkaline Phosphatase 44 U/L      Bilirubin, Total 0.2 mg/dL  Globulin 5.6 (H) g/dL      Albumin/Globulin Ratio 0.5 (L)      Anion Gap 8.0     Hemolysis index [[id number]] Collected:05/12/13 0436     Hemolysis Index 1 Updated:05/12/13 0554    GFR [[id number]] Collected:05/12/13 0436     EGFR >60.0 Updated:05/12/13 0554    Urinalysis with microscopic [[id number]]  (Abnormal) Collected:05/12/13 0040    Specimen Information:Urine Updated:05/12/13 0056     Urine Type Clean Catch      Color, UA Straw      Clarity, UA Clear      Specific Gravity UA 1.010      Urine pH 7.0       Leukocyte Esterase, UA Negative      Nitrite, UA Negative      Protein, UR Negative      Glucose, UA 500 (A)      Ketones UA Negative      Urobilinogen, UA Negative mg/dL      Bilirubin, UA Negative      Blood, UA Negative      WBC, UA 0 - 5      Squamous Epithelial Cells, Urine 0 - 5     Blood Culture #1 [[id number]] Collected:05/11/13 1345    Specimen Information:Blood / Blood Updated:05/11/13 1534    Narrative:    8ml required    Blood Culture #2 [[id number]] Collected:05/11/13 1345    Specimen Information:Blood / Blood Updated:05/11/13 1534    Narrative:    8ml required    CBC Pathologist Review [[id number]] Collected:05/11/13 1345     CBC Pathologist Review See Note Updated:05/11/13 1435    CBC and differential [[id number]]  (Abnormal) Collected:05/11/13 1345    Specimen Information:Blood / Blood Updated:05/11/13 1419     WBC 12.39 (H)      RBC 4.37      Hgb 13.3 g/dL      Hematocrit 64.3 %      MCV 89.5 fL      MCH 30.4 pg      MCHC 34.0 g/dL      RDW 13 %      Platelets 21 (L)      MPV Unmeasured fL      Neutrophils 68 %      Lymphocytes Automated 20 %      Monocytes 10 %      Eosinophils Automated 0 %      Basophils Automated 0 %      Immature Granulocyte 0 %      Nucleated RBC 0      Neutrophils Absolute 8.46 (H)      Abs Lymph Automated 2.54      Abs Mono Automated 1.29 (H)      Abs Eos Automated 0.06      Absolute Baso Automated 0.04      Absolute Immature Granulocyte 0.04     Influenza A/B Virus Antigen [[id number]] Collected:05/11/13 1345    Specimen Information:Nasopharyngeal / Nasal Aspirate Updated:05/11/13 1417    Narrative:    ORDER#: [id number]                                    ORDERED BY: Avanell Shackleton  SOURCE: Nasal Aspirate                               COLLECTED:  05/11/13 13:45  ANTIBIOTICS  AT COLL.:                                RECEIVED :  05/11/13 13:54  Influenza Rapid Antigen A&B                FINAL       05/11/13 14:17  05/11/13   Negative for Influenza A and B             Reference Range:  Negative      Mononucleosis Screen [[id number]] Collected:05/11/13 1345    Specimen Information:Blood Updated:05/11/13 1416     Mono Screen Negative     Comprehensive Metabolic Panel (CMP) [[id number]]  (Abnormal) Collected:05/11/13 1345    Specimen Information:Blood Updated:05/11/13 1415     Glucose 82 mg/dL      BUN 5.0 (L) mg/dL      Creatinine 0.8 mg/dL      Sodium 147 mEq/L      Potassium 3.8 mEq/L      Chloride 102 mEq/L      CO2 24 mEq/L      CALCIUM 8.4 (L) mg/dL      Protein, Total 7.6 g/dL      Albumin 3.3 (L) g/dL      AST (SGOT) 22 U/L      ALT 17 U/L      Alkaline Phosphatase 48 U/L      Bilirubin, Total 0.3 mg/dL      Globulin 4.3 (H) g/dL      Albumin/Globulin Ratio 0.8 (L)      Anion Gap 13.0     Hemolysis index [[id number]] Collected:05/11/13 1345     Hemolysis Index 0 Updated:05/11/13 1415    GFR [[id number]] Collected:05/11/13 1345     EGFR >60.0 Updated:05/11/13 1415    Rapid Group A Strep POC [[id number]] Collected:05/11/13 1402    Specimen Information:Throat Updated:05/11/13 1405     POCT QC Pass      Rapid Strep A Screen POCT Negative       Comment        Result:     Negative Results should be confirmed by throat Cx to confirm absence of Strep A inf.    Lactic Acid [[id number]] Collected:05/11/13 1344    Specimen Information:Blood Updated:05/11/13 1344     Lactic acid 0.9 mEq/L           Recent CBC   Recent Labs   Basename 05/13/13 0557    RBC 3.82*    HGB 11.3*    HCT 34.9*    MCV 91.4    MCH 29.6    MCHC 32.4    RDW 14    MPV 13.4*    LABPLAT --       Rads:   Radiological Procedure reviewed.     Signed by: Christell Faith

## 2013-05-12 NOTE — H&P (Signed)
Patient Type: I     ATTENDING PHYSICIAN: Bing Neighbors, MD     CHIEF COMPLAINT:    Sore throat with congestion.     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  She also has been experiencing  fever.  She has been taking daily penicillin without much relief.  She has  been taking penicillin for the past 3 days.  She tells me that she is  supposed to be taking penicillin on a daily basis as a prophylactic  measure; however, only takes it whenever she gets infections.  Moreover,  she also has been taking Tylenol, Aleve, and Nyquil without remarkable  relief.  She also has been experiencing petechiae on her back and  extremities.       Upon initial presentation to the emergency room, she was noted to have a  blood pressure of 98/51 with a temperature of 101.5.  Routine blood work  done revealed evidence of leukocytosis with a presenting white count of  12,000.  She was noted to have evidence of marked thrombocytopenia with a  presenting platelet count of 21,000.  Other workup out of the emergency  room included a Rapid Strep test, which was negative.  A mononucleosis  screen was also done, which was negative.  The patient was given a dose of  Solu-Medrol with some relief in throat swelling.  Hematology was contacted  by emergency room physician.  She is now receiving IVIG.     PAST MEDICAL HISTORY:  ITP.     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     ALLERGIES:  No known drug allergies.     OUTPATIENT MEDICATIONS:  1.  Penicillin 4 times a day.  2.  Aleve, Nyquil and Tylenol on as needed basis.     SOCIAL HISTORY:  She is a Consulting civil engineer.  She lives at home with her family.  She does not  smoke  cigarettes.  She does not drink alcohol.     FAMILY HISTORY:  Her father has coronary artery disease.  Her mother is healthy.  She has 4  brothers and 1 sister who are all healthy.     REVIEW OF SYSTEMS:  No headaches.  Positive for cough.  Positive for some nasal bleeds.   Positive for sore throat.  Positive for congestion.  Positive for increased  mucus production.  Positive for right ear hearing deficit due to  congestion.  Positive for shortness of breath.  No nausea, vomiting, or  diarrhea.  No hemoptysis.  No bright red blood per rectum.  No increased  bleeding with menses.  She does easily bruise.  She was experiencing  petechiae on her back and her extremities, which are now improving.     PHYSICAL EXAMINATION:  VITAL SIGNS:  Blood pressure 108/60 with a pulse of 90, breathing 16,  temperature 99.1.  GENERAL:  Grace Mccarthy lying in bed in no apparent distress.  HEENT:  Atraumatic, normocephalic, sclerae is anicteric.  Oral mucosa is  moist.  NECK:  Supple, no visible JVD.  No lymphadenopathy.  CARDIOVASCULAR:  Regular rate and rhythm, S1, S2.  CHEST:  Clear to auscultation.  ABDOMEN:  Soft, nontender, nondistended, positive bowel sounds.  EXTREMITIES:  There is no edema.  There is no evidence of any skin rash or  petechiae at present.  NEUROLOGIC:  The patient is awake, alert, oriented x3.     LABORATORY DATA:  Sodium 139, potassium 3.8, chloride 102, CO2 24, BUN 5, creatinine 0.8,  calcium 8.4, serum albumin 3.3, AST 22, ALT 17, total bilirubin 0.3.   Lactic acid 0.9.  WBC count 12.39, hemoglobin 13.3, hematocrit 39.1,  platelet count 21.     IMPRESSION:  1.  Possible pharyngitis, rule out pneumonia.  2.  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     PLAN:  1.  Admit to regular medical floor.  2.  IV fluids.  3.  Intravenous antibiotics.  4.  Obtain chest x-ray.  5.  IVIG per hematology services.  6.  Hematology evaluation.  7.  Infectious disease evaluation for  prophylactic antibiotics given  history of ITP and splenectomy.  8.  The case has been discussed with the mother at the bedside.           D:  05/11/2013 19:08 PM by Dr. [name], MD ([id number])  T:  05/11/2013 20:24 PM by       Everlean Cherry: [id number]) (Doc ID: [id number])

## 2013-05-12 NOTE — Progress Notes (Signed)
PROGRESS NOTE    Date Time: 05/12/2013 2:30 PM  Patient Name: Grace Mccarthy Day: 1    Assessment:   1. Possible pharyngitis,?Sinusitis   2. Severe thrombocytopenia with a history of idiopathic thrombocytopenic   purpura.     Plan:   IV Abs  Decongestants  Flonase  D/C home in am.  Subjective:   Seen and examined.  Seen by Hematology.  Bone marrow biopsy being considered.  Still feels congested.    Medications:      Scheduled Meds: PRN Meds:         cefTRIAXone 1 g Intravenous Q24H SCH   immune globulin (human) 1 g/kg (Adjusted) Intravenous Q24H   [COMPLETED] methylprednisolone sodium succinate 50 mg Intravenous Once   [COMPLETED] sodium chloride 1,000 mL Intravenous Once         Continuous Infusions:       . sodium chloride 100 mL/hr at 05/12/13 0452         [COMPLETED] acetaminophen 650 mg PRN   acetaminophen 650 mg Q4H PRN   [COMPLETED] diphenhydrAMINE 25 mg PRN   diphenhydrAMINE 6.5 mg PRN   influenza 0.5 mL Prior to discharge             Review of Systems:   [x] General: weight: [] Stable [] Loss []  Gain [x] No Fever [] Chills []  Rigors    [x] Respiratory: [x] No Shortness of breath [x]   cough [x] No  wheeze [x]  No hemoptysis    [x] Cardiac: [x]  No Chest pain [x] No  palpitations [x] No  PND [x]  No orthopnea    [x] GI: [x] No abdominal pain [x]  No nausea [x] No vomiting [x] No diarrhea []  constipation            []  blood in stool  [x] change in bowel habits    [x] Genito-urinary: [x] No dysuria [x] No frequency [x] No  urgency [x] No hematuria                                 [x]  Notrouble voiding    [x] CNS: [x] No focal weakness [x] No ataxia [x] No seizures [] Encephalopathy    [x] Endocrine: [] Fatigue [x] No polyuria [x]  No polydipsia [] General weakness    [x] Skin: [x]  No rash [x] No pruritis []   Ecchymosis []  Pressure ulcer    [x] Musculoskeletal: [] negative      Physical Exam:      Filed Vitals:    05/11/13 1602 05/11/13 1804 05/11/13 2148 05/12/13 0803   BP: 100/53 108/60 94/57 105/57   Pulse: 68 90 69 71   Temp: 101.5 F  (38.6 C) 99.1 F (37.3 C) 96.8 F (36 C) 96.8 F (36 C)   TempSrc:  Oral  Oral   Resp: 16 16 16 16    Height:       Weight:       SpO2: 99% 96% 98% 100%       No intake or output data in the 24 hours ending 05/12/13 1430        [x] General appearance: [x] No distress [x]  Alert  [x]  Well developed/well nourshied,                    [x] HEENT: [x]  Normocephalic [x] pupils reactive [x] EOMI [x] No pallor [x] No cyanosis                     [x] No icterus.     [x] Mouth:   [x]  Mucous membranes moist [x] Pharynx normal without lesions     [x] Neck: [x]  Supple  [x] No adenopathy  [x] No  JVD   [x]   NoThyromegaly [x]  No carotid bruits     [x] Chest: [x] Clear to auscultation  [x]  No Wheeze  [x]  No rales   [x]  No rhonchi                   [x]  No Crackles   [x]   No egophony     [x] Heart: [x] RRR  []  Irregular rhythm  [x] Normal S1, S2 []  No murmurs [x]  No rubs                                                                                        [x] Abdomen: [x]  Soft  [x]  Nontender   [x]   Nondistended  [x]  No masses                          [x]  No Organomegaly   [x] No rebound rigidity or guarding     [x] Extremities:[]  Peripheral pulses diminished  []   Pedal edema    []  Stasis changes                [x]  No cellulitis    [x]  No calf tenderness    [x] Musculoskeletal: [x]  No joint tenderness [x]  No deformity  [x] No swelling     [x] Neurological: [x] Alert []  Anxious []  Drowsy [] Confused []  Lethargic                               [] No focal weakness  [] No sensory deficit  [] Normal CN's     [x] Skin: [x] Normal coloration and turgor [x]  No rashes []  No skin lesions noted          Labs:        Lab 05/12/13 0436 05/11/13 1345   NA 136 139   K 4.0 3.8   CL 106 102   CO2 22 24   BUN 8.0 5.0*   CREAT 0.7 0.8   EGFR >60.0 >60.0   GLU 185* 82   CA 8.2* 8.4*   ALB 2.6* 3.3*   PHOS -- --       Lab 05/12/13 0436 05/11/13 1345   WBC 6.64 12.39*   HGB 11.9* 13.3   HCT 35.3* 39.1   PLT 48* 21*   NEUTROPCT -- --   MONOPCT -- --       Lab 05/12/13 0436 05/11/13  1345   ALKPHOS 44 48   BILITOTAL 0.2 0.3   BILIDIRECT -- --   PROT 8.2 7.6   ALB 2.6* 3.3*   ALT 17 17   AST 19 22         Lab 05/12/13 0040   URINETYPE Clean Catch   COLORUA Straw   CLARITYUA Clear   SPECGRAVUA 1.010   URINEPH 7.0   NITRITEUA Negative   PROTEINUA --   GLUUA --   KETONESUA Negative   UROBILIUA Negative   BILIUA Negative   BLOODUA Negative   RBCUA --   WBCUA 0 - 5   URINEBACTERI --   GRANCASTUA --       Rads:     Radiology Results (24  Hour)     Procedure Component Value Units Date/Time    XR Chest 2 Views [[id number]] Collected:05/12/13 0625    Order Status:Completed  Updated:05/12/13 4854    Narrative:    History: Pneumonia    Technique: PA and Lateral    Comparison: None.    Findings:  The lungs appear clear.  There is no pneumothorax.  The heart is normal in size.    The mediastinum is within normal limits.             Impression:     No active disease is seen in the chest.    Laurena Slimmer, MD   05/12/2013 6:25 AM              Kayren Eaves Van Clines, MD.

## 2013-05-12 NOTE — Plan of Care (Signed)
Problem: Infection/Potential for Infection  Goal: Free from infection  Outcome: Progressing  Pt is afebrile today, platelet is 48, no s/s of bleeding noted. Continue on ivf and iv antibiotic treatment. Pt is receiving 2nd dose of IVIG today, med verified with Dr Aron Baba per pt request.tolerationg well.premedicated with benadryl iv. Continue monitor for vs and lab results.

## 2013-05-12 NOTE — Plan of Care (Signed)
Problem: Bleeding Precautions  Goal: Free from bleeding  Outcome: Progressing  Intervention: Report signs of bleeding  Pt tolerated IVIG, on IVF, vital signs within normal limits, denies any pain, maintained bleeding precautions, no s/s of bleeding noted, not in respiratory distress, pt on daily IV antibiotic, pt for chest x-ray.  PLAN: continue to monitor for any change in pt's status, maintain bleeding precautions, check am labs.

## 2013-05-12 NOTE — Treatment Plan (Signed)
Infectious Diseases & Tropical  Medicine  Full Consult Dictated        Full consult dictated: # [id number]    Assessment:     H/O of ITP s/p splenectomy   Thrombocytopenia improving   Pharyngitis   No evidence of rheumatic heart disease or RF   Patient is on chronic penicillin       Plan:      Continue ceftriaxone   D/C  Chronic penicillin therapy   Discharge planning once cleared by hematology on Augmentin x 7 days   Maintain good oral hygiene   D/W patient and father in details    Thanks for consultation      Jayelle Page A. Janalyn Rouse, MD   05/12/2013

## 2013-05-12 NOTE — Consults (Signed)
Service Date: 05/12/2013     Patient Type: I     CONSULTING PHYSICIAN: Nevada Crane MD     REFERRING PHYSICIAN: Providence Lanius MD     HISTORY OF PRESENT ILLNESS:  Grace Mccarthy is a 22 year old female with a history of ITP, requiring  splenectomy several years ago and a history of recurrent pharyngitis.  The  patient is admitted because of sore throat, fever, and was found to be  severely thrombocytopenic with a platelet count of 21,000.  The patient is  started on steroids and also received IVIG and her platelet count has  improved to 48,000.  Her leukocytosis is also improved.  Her fever is  improved.  She is currently on ceftriaxone.  I was asked to see the patient  because she was placed on chronic penicillin therapy by a physician in work  and she is taking on and off penicillin.  She denies having any heart  problems or any diagnosis of rheumatic fever or rheumatic heart disease.   Other than a sore throat and fevers, she is feeling fine which is also  improved now.  There is no diarrhea, no nausea or vomiting, no chest pains,  cough or shortness of breath.  No urinary symptoms.  The infectious  mononucleosis screen was also negative and Streptococcus screen was also  negative.  The blood cultures are ordered and are pending.     PAST MEDICAL HISTORY:  Consistent with ITP.     PAST SURGICAL HISTORY:  Splenectomy.     ALLERGIES:  No known drug allergies.     MEDICATIONS:  Penicillin 4 times a day, which the patient takes on and off, Levaquin and  Nyquil.     SOCIAL HISTORY:  No smoking, no alcohol.     FAMILY HISTORY:  Significant for coronary artery disease to her father.     REVIEW OF SYSTEMS:  The patient had fevers, denies having any seizures or syncopal episodes.   Denies having any ear, nose problems.  She has sore throat and had  occasional cough, no shortness of breath, no chest pain.  She complains of  some mucus production and congestion.  There is no nausea, vomiting, or  diarrhea.  No abdominal  pain.  No urinary symptoms and no skin rashes.     PHYSICAL EXAMINATION:  GENERAL:  The patient is awake and alert, appears to be in no distress.  VITAL SIGNS:  Temperature 96.8, blood pressure 105/57, pulse is 71,  respiratory rate 16.  HEENT:  Head is normocephalic and atraumatic.  Pupils are reactive to  light.  Sclerae are anicteric.  Oral mucosa is normal.  She has a hematoma  over the tongue.  Throat is congested, but there are no membranes.    NECK:  There is no neck lymphadenopathy, no thyromegaly.  LUNGS:  Clear to auscultation.  HEART:  Sounds are regular rate and rhythm.  There is no murmur.  ABDOMEN:  Soft, nontender.  There is no organomegaly.  EXTREMITIES:  No edema, no clubbing, and no cyanosis.  NEUROLOGIC:  Grossly intact.     LABORATORY DATA:  Her white cell count is 6.6, hemoglobin 11.9; hematocrit 35.3; platelet  count is 48,000, neutrophils 75%, bands are 2%.  Sodium 136, potassium 4.0,  chloride 106, bicarbonate 22, BUN 8, creatinine 0.7, alkaline phosphatase  44, ALT 17, AST 19.  Her UA is unremarkable.  Blood cultures are ordered  and are pending.  Influenza screen is negative.  Rapid  Strep screen is  negative.     ASSESSMENT AND PLAN:  1.  History of idiopathic thrombocytopenic purpura status post splenectomy.  2.  Thrombocytopenia.  3.  Pharyngitis responding well to ceftriaxone.  4.  No evidence of rheumatic heart disease or rheumatic fever.  5.  The patient is on chronic penicillin therapy which she takes on and off  at home     So plan at this time would be to continue ceftriaxone until she is  evaluated by hematology and once she is cleared from the hematology point  of view, she can be discharged home on p.o. Augmentin for 7 days, maintain  good oral hygiene and gargles.  We will discontinue chronic penicillin  therapy as it is not indicated rather than will produce more resistance.   Discussed plan with the patient and her father in detail.  We will monitor  the patient clinically.      Thank you for the consultation.           D:  05/12/2013 11:54 AM by Dr. Nevada Crane, MD [phone])  T:  05/12/2013 13:46 PM by [facility]      (Conf: [id number]) (Doc ID: [id number])

## 2013-05-13 DIAGNOSIS — D696 Thrombocytopenia, unspecified: Secondary | ICD-10-CM

## 2013-05-13 LAB — CBC WITH MANUAL DIFFERENTIAL
Band Neutrophils Absolute: 0 (ref 0.00–1.00)
Band Neutrophils: 0 %
Basophils Absolute Manual: 0 (ref 0.00–0.20)
Basophils Manual: 0 %
Cell Morphology: NORMAL
Eosinophils Absolute Manual: 0.15 (ref 0.00–0.70)
Eosinophils Manual: 1 %
Hematocrit: 34.9 % — ABNORMAL LOW (ref 37.0–47.0)
Hgb: 11.3 g/dL — ABNORMAL LOW (ref 12.0–16.0)
Lymphocytes Absolute Manual: 2.76 (ref 0.50–4.40)
Lymphocytes Manual: 19 %
MCH: 29.6 pg (ref 28.0–32.0)
MCHC: 32.4 g/dL (ref 32.0–36.0)
MCV: 91.4 fL (ref 80.0–100.0)
MPV: 13.4 fL — ABNORMAL HIGH (ref 9.4–12.3)
Monocytes Absolute: 0.15 (ref 0.00–1.20)
Monocytes Manual: 1 %
Neutrophils Absolute Manual: 11.48 — ABNORMAL HIGH (ref 1.80–8.10)
Nucleated RBC: 0 (ref 0–1)
Platelets: 212 (ref 140–400)
RBC: 3.82 — ABNORMAL LOW (ref 4.20–5.40)
RDW: 14 % (ref 12–15)
Segmented Neutrophils: 79 %
WBC: 14.53 — ABNORMAL HIGH (ref 3.50–10.80)

## 2013-05-13 LAB — COMPREHENSIVE METABOLIC PANEL
ALT: 18 U/L (ref 0–55)
AST (SGOT): 19 U/L (ref 5–34)
Albumin/Globulin Ratio: 0.3 — ABNORMAL LOW (ref 0.9–2.2)
Albumin: 2.2 g/dL — ABNORMAL LOW (ref 3.5–5.0)
Alkaline Phosphatase: 38 U/L — ABNORMAL LOW (ref 40–150)
Anion Gap: 8 (ref 5.0–15.0)
BUN: 7 mg/dL (ref 7.0–19.0)
Bilirubin, Total: 0.1 mg/dL — ABNORMAL LOW (ref 0.2–1.2)
CO2: 22 mEq/L (ref 22–29)
Calcium: 8.1 mg/dL — ABNORMAL LOW (ref 8.5–10.5)
Chloride: 108 mEq/L — ABNORMAL HIGH (ref 98–107)
Creatinine: 0.7 mg/dL (ref 0.6–1.0)
Globulin: 6.3 g/dL — ABNORMAL HIGH (ref 2.0–3.6)
Glucose: 79 mg/dL (ref 70–100)
Potassium: 3.9 mEq/L (ref 3.5–5.1)
Protein, Total: 8.5 g/dL — ABNORMAL HIGH (ref 6.0–8.3)
Sodium: 138 mEq/L (ref 136–145)

## 2013-05-13 LAB — HEMOLYSIS INDEX: Hemolysis Index: 5 (ref 0–18)

## 2013-05-13 LAB — GFR: EGFR: >60

## 2013-05-13 MED ORDER — PSEUDOEPHEDRINE HCL ER 120 MG PO TB12
120.0000 mg | ORAL_TABLET | Freq: Two times a day (BID) | ORAL | Status: DC
Start: 2013-05-13 — End: 2013-05-17

## 2013-05-13 MED ORDER — CETIRIZINE HCL 10 MG PO TABS
10.0000 mg | ORAL_TABLET | Freq: Every day | ORAL | Status: DC
Start: 2013-05-13 — End: 2013-05-27

## 2013-05-13 MED ORDER — PREDNISONE 20 MG PO TABS
60.0000 mg | ORAL_TABLET | Freq: Every morning | ORAL | Status: DC
Start: 2013-05-13 — End: 2013-05-14
  Administered 2013-05-13 – 2013-05-14 (×2): 60 mg via ORAL
  Filled 2013-05-13 (×2): qty 3

## 2013-05-13 MED ORDER — FLUTICASONE PROPIONATE 50 MCG/ACT NA SUSP
2.0000 | Freq: Every day | NASAL | Status: DC
Start: 2013-05-13 — End: 2013-05-27

## 2013-05-13 MED ORDER — AMOXICILLIN-POT CLAVULANATE 875-125 MG PO TABS
1.0000 | ORAL_TABLET | Freq: Two times a day (BID) | ORAL | Status: AC
Start: 2013-05-13 — End: 2013-05-20

## 2013-05-13 MED ORDER — PREDNISONE 20 MG PO TABS
20.0000 mg | ORAL_TABLET | Freq: Every morning | ORAL | Status: DC
Start: 2013-05-13 — End: 2013-06-10

## 2013-05-13 NOTE — Plan of Care (Signed)
Problem: Safety  Goal: Patient will be free from injury during hospitalization  Outcome: Progressing  Pt up and ambulates with steady gait.  Safety maintained.    New medication prednisone initiated today. Teaching done related to medication purpose, effects and side effects.  Pt verbalized understanding and denied any negative side effects after administration.    Problem: Pain  Goal: Patient's pain/discomfort is manageable  Outcome: Progressing  Pt reports discomfort in her throat but denies pain when asked and declined offers of pain medication.

## 2013-05-13 NOTE — Progress Notes (Signed)
Infectious Diseases & Tropical Medicine  Progress Note    05/13/2013   Grace Mccarthy [id number] is a 22 y.o. female,       Assessment:     H/O of ITP s/p splenectomy  Thrombocytopenia improving  Pharyngitis improving  No evidence of rheumatic heart disease or RF  Patient is on chronic penicillin   Clinically improved      Plan:      Ok to discharge from ID standpoint   Augmentin X 7 days   Prescription in the chart   Side effects discussed with the patient   D/C  Chronic penicillin therapy   Maintain good oral hygiene   D/W patient and father in details   D/W Dr. Georga Kaufmann   D/W Dr. Janee Morn    ROS:     General:  no fever, no chills, no rigor, awake and alert, feeling better   HEENT: no neck pain, c/o throat pain but improved  Endocrine: no fatigue   Respiratory: no cough, shortness of breath, or wheezing   Cardiovascular: no chest pain   Gastrointestinal: no abdominal pain,no N/V/D  Genito-Urinary: no dysuria, trouble voiding, or hematuria   Musculoskeletal: no edema  Neurological: no generalized weakness   Dermatological: no rash, no ulcer    Physical Examination:     Blood pressure 106/64, pulse 66, temperature 96.8 F (36 C), temperature source Oral, resp. rate 16, height 1.651 m (5\' 5" ), weight 49.896 kg (110 lb), SpO2 100.00%.     General Appearance: Comfortable, and in no acute distress. Awake and alert, feeling better, ambulating     HEENT: Pupils are equal, round, and reactive to light. Minimal erythema of the pharynx, no membranes, tongue hematoma    Lungs:  CTA   Heart: RRR   Chest: Symmetric chest wall expansion.    Abdomen: soft ,non tender,no hepatosplenomegaly   Neurological: No focal deficit   Extremities: No edema    Laboratory And Diagnostic Studies:     Recent Labs   [facility] 05/13/13 0557 05/12/13 0436    WBC 14.53* 6.64    HGB 11.3* 11.9*    HCT 34.9* 35.3*    PLT 212 48*     Recent Labs   [facility] 05/13/13 0557 05/12/13 0436    NA 138 136    K 3.9 4.0    CL 108*  106    CO2 22 22    BUN 7.0 8.0    CREAT 0.7 0.7    GLU 79 185*    CA 8.1* 8.2*     Recent Labs   [facility] 05/13/13 0557 05/12/13 0436    AST 19 19    ALT 18 17    ALKPHOS 38* 44    PROT 8.5* 8.2    ALB 2.2* 2.6*       Current Meds:      Scheduled Meds: PRN Meds:         cefTRIAXone 1 g Intravenous Q24H SCH   cetirizine 10 mg Oral Daily   fluticasone 1 spray Each Nare Daily   [COMPLETED] immune globulin (human) 1 g/kg (Adjusted) Intravenous Q24H   pseudoephedrine 120 mg Oral Q12H SCH       Continuous Infusions:       . sodium chloride 100 mL/hr at 05/12/13 0452         acetaminophen 650 mg Q4H PRN   [COMPLETED] diphenhydrAMINE 6.5 mg PRN   influenza 0.5 mL Prior to discharge  Kharisma Glasner A. Janalyn Rouse, M.D.  05/13/2013  9:01 AM

## 2013-05-13 NOTE — Progress Notes (Signed)
PROGRESS NOTE    Date Time: 05/13/2013 7:07 PM  Patient Name: Grace Mccarthy Day: 2    Assessment:   1. Possible pharyngitis,?Sinusitis   2. Severe thrombocytopenia with a history of idiopathic thrombocytopenic   purpura.     Plan:   IV Abs  Decongestants  Flonase  D/C home in am.  D/W ID  D/W Hematology.  D/W Father  Subjective:   Seen and examined.  Feels better, but woke up with throat swelling.    Medications:      Scheduled Meds: PRN Meds:           cefTRIAXone 1 g Intravenous Q24H SCH   cetirizine 10 mg Oral Daily   fluticasone 1 spray Each Nare Daily   predniSONE 60 mg Oral QAM W/BREAKFAST   pseudoephedrine 120 mg Oral Q12H SCH         Continuous Infusions:       . sodium chloride 100 mL/hr at 05/13/13 1143         acetaminophen 650 mg Q4H PRN   influenza 0.5 mL Prior to discharge             Review of Systems:   [x] General: weight: [] Stable [] Loss []  Gain [x] No Fever [] Chills []  Rigors    [x] Respiratory: [x] No Shortness of breath [x]   cough [x] No  wheeze [x]  No hemoptysis    [x] Cardiac: [x]  No Chest pain [x] No  palpitations [x] No  PND [x]  No orthopnea    [x] GI: [x] No abdominal pain [x]  No nausea [x] No vomiting [x] No diarrhea []  constipation            []  blood in stool  [x] change in bowel habits    [x] Genito-urinary: [x] No dysuria [x] No frequency [x] No  urgency [x] No hematuria                                 [x]  Notrouble voiding    [x] CNS: [x] No focal weakness [x] No ataxia [x] No seizures [] Encephalopathy    [x] Endocrine: [] Fatigue [x] No polyuria [x]  No polydipsia [] General weakness    [x] Skin: [x]  No rash [x] No pruritis []   Ecchymosis []  Pressure ulcer    [x] Musculoskeletal: [] negative      Physical Exam:      Filed Vitals:    05/12/13 0803 05/12/13 1604 05/12/13 2259 05/13/13 0741   BP: 105/57 92/48 99/43  106/64   Pulse: 71 57 51 66   Temp: 96.8 F (36 C) 95.9 F (35.5 C) 96.6 F (35.9 C) 96.8 F (36 C)   TempSrc: Oral Oral  Oral   Resp: 16 16 16 16    Height:       Weight:       SpO2: 100%  99% 98% 100%       No intake or output data in the 24 hours ending 05/13/13 1907        [x] General appearance: [x] No distress [x]  Alert  [x]  Well developed/well nourshied,                    [x] HEENT: [x]  Normocephalic [x] pupils reactive [x] EOMI [x] No pallor [x] No cyanosis                     [x] No icterus.     [x] Mouth:   [x]  Mucous membranes moist [x] Pharynx normal without lesions     [x] Neck: [x]  Supple  [x] No adenopathy  [x] No JVD   [x]   NoThyromegaly [x]  No carotid bruits     [  x]Chest: [x] Clear to auscultation  [x]  No Wheeze  [x]  No rales   [x]  No rhonchi                   [x]  No Crackles   [x]   No egophony     [x] Heart: [x] RRR  []  Irregular rhythm  [x] Normal S1, S2 []  No murmurs [x]  No rubs                                                                                        [x] Abdomen: [x]  Soft  [x]  Nontender   [x]   Nondistended  [x]  No masses                          [x]  No Organomegaly   [x] No rebound rigidity or guarding     [x] Extremities:[]  Peripheral pulses diminished  []   Pedal edema    []  Stasis changes                [x]  No cellulitis    [x]  No calf tenderness    [x] Musculoskeletal: [x]  No joint tenderness [x]  No deformity  [x] No swelling     [x] Neurological: [x] Alert []  Anxious []  Drowsy [] Confused []  Lethargic                               [] No focal weakness  [] No sensory deficit  [] Normal CN's     [x] Skin: [x] Normal coloration and turgor [x]  No rashes []  No skin lesions noted          Labs:        Lab 05/13/13 0557 05/12/13 0436 05/11/13 1345   NA 138 136 139   K 3.9 4.0 3.8   CL 108* 106 102   CO2 22 22 24    BUN 7.0 8.0 5.0*   CREAT 0.7 0.7 0.8   EGFR >60.0 >60.0 >60.0   GLU 79 185* 82   CA 8.1* 8.2* 8.4*   ALB 2.2* 2.6* 3.3*   PHOS -- -- --       Lab 05/13/13 0557 05/12/13 0436 05/11/13 1345   WBC 14.53* 6.64 12.39*   HGB 11.3* 11.9* 13.3   HCT 34.9* 35.3* 39.1   PLT 212 48* 21*   NEUTROPCT -- -- --   MONOPCT -- -- --       Lab 05/13/13 0557 05/12/13 0436 05/11/13 1345   ALKPHOS 38* 44 48    BILITOTAL 0.1* 0.2 0.3   BILIDIRECT -- -- --   PROT 8.5* 8.2 7.6   ALB 2.2* 2.6* 3.3*   ALT 18 17 17    AST 19 19 22          Lab 05/12/13 0040   URINETYPE Clean Catch   COLORUA Straw   CLARITYUA Clear   SPECGRAVUA 1.010   URINEPH 7.0   NITRITEUA Negative   PROTEINUA --   GLUUA --   KETONESUA Negative   UROBILIUA Negative   BILIUA Negative   BLOODUA Negative   RBCUA --   WBCUA 0 - 5   URINEBACTERI --  GRANCASTUA --       Rads:     Radiology Results (24 Hour)     ** No Results found for the last 24 hours. **              Jerene Pitch, MD.

## 2013-05-13 NOTE — Plan of Care (Signed)
Problem: Infection/Potential for Infection  Goal: Free from infection  Outcome: Progressing  Patient A/O x 4.  Afebrile and other vitals signs stable as well.  BP runs low.  No s/sx of bleeding noted.  Platelets were 48 yesterday.  She is on IV abx and IVIG- IVIG for her ITP. No c/o pain.  Will continue to monitor for bleeding, monitor vitals and labs, monitor throat, continue with IV abx and IVIG.

## 2013-05-13 NOTE — Progress Notes (Signed)
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 No more bleeding    Medications:     Current Facility-Administered Medications   Medication Dose Route Frequency   . cefTRIAXone  1 g Intravenous Q24H SCH   . cetirizine  10 mg Oral Daily   . fluticasone  1 spray Each Nare Daily   . [COMPLETED] immune globulin (human)  1 g/kg (Adjusted) Intravenous Q24H   . pseudoephedrine  120 mg Oral Q12H SCH       Review of Systems:   A comprehensive review of systems was: History obtained from the patient  General ROS: positive for  - fatigue  negative for - chills or fever  Hematological and Lymphatic ROS: negative for - bruising, night sweats or bleeding or petechiae  Respiratory ROS: no cough, shortness of breath, or wheezing  Cardiovascular ROS: no chest pain or dyspnea on exertion  Gastrointestinal ROS: no abdominal pain, change in bowel habits, or black or bloody stools  Musculoskeletal ROS: negative  Neurological ROS: no TIA or stroke symptoms  Dermatological ROS: negative for rash and bruising or petechiae    Physical Exam:     Filed Vitals:    05/13/13 0741   BP: 106/64   Pulse: 66   Temp: 96.8 F (36 C)   Resp: 16   SpO2: 100%       Intake and Output Summary (Last 24 hours) at Date Time  No intake or output data in the 24 hours ending 05/13/13 0854    General appearance - alert, well appearing, and in no distress  Mental status - alert, oriented to person, place,  and time  Mouth - erythematous  Skin - normal coloration and turgor, no rashes, no suspicious skin lesions noted    Labs:     Results     Procedure Component Value Units Date/Time    CBC WITH MANUAL DIFFERENTIAL [[id number]]  (Abnormal) Collected:05/13/13 0557     WBC 14.53 (Mccarthy) Updated:05/13/13 0755     RBC 3.82 (L)      Hgb 11.3 (L) g/dL      Hematocrit 62.1 (L) %      MCV 91.4 fL      MCH 29.6 pg      MCHC 32.4 g/dL      RDW 14 %      Platelets 212      MPV 13.4 (Mccarthy) fL      Segmented Neutrophils 79 %      Band Neutrophils 0 %      Lymphocytes Manual 19 %      Monocytes Manual 1 %      Eosinophils Manual 1 %      Basophils Manual 0 %      Nucleated RBC 0      Abs Seg Manual 11.48 (Mccarthy)  Bands Absolute 0.00      Absolute Lymph Manual 2.76      Monocytes Absolute 0.15      Absolute Eos Manual 0.15      Absolute Baso Manual 0.00      Cell Morphology: Normal     Comprehensive metabolic panel [[id number]]  (Abnormal) Collected:05/13/13 0557    Specimen Information:Blood Updated:05/13/13 0702     Glucose 79 mg/dL      BUN 7.0 mg/dL      Creatinine 0.7 mg/dL      Sodium 782 mEq/L      Potassium 3.9 mEq/L      Chloride 108 (Mccarthy) mEq/L      CO2 22 mEq/L      CALCIUM 8.1 (L) mg/dL      Protein, Total 8.5 (Mccarthy) g/dL      Albumin 2.2 (L) g/dL      AST (SGOT) 19 U/L      ALT 18 U/L      Alkaline Phosphatase 38 (L) U/L      Bilirubin, Total 0.1 (L) mg/dL      Globulin 6.3 (Mccarthy) g/dL      Albumin/Globulin Ratio 0.3 (L)      Anion Gap 8.0     Hemolysis index [[id number]] Collected:05/13/13 0557     Hemolysis Index 5 Updated:05/13/13 0702    GFR [[id number]] Collected:05/13/13 0557     EGFR >60.0 Updated:05/13/13 0702    Blood Culture #1 [[id number]] Collected:05/11/13 1345    Specimen Information:Blood / Blood Updated:05/12/13 1615    Narrative:    ORDER#: [id number]                                    ORDERED BY: Avanell Shackleton  SOURCE: Blood venous                                 COLLECTED:  05/11/13 13:45  ANTIBIOTICS AT COLL.:                                 RECEIVED :  05/11/13 15:34  Culture Blood                              PRELIM      05/12/13 16:15  05/12/13   No Growth after 1 day/s of incubation.      Blood Culture #2 [[id number]] Collected:05/11/13 1345    Specimen Information:Blood / Blood Updated:05/12/13 1615    Narrative:    ORDER#: [id number]                                    ORDERED BY: Avanell Shackleton  SOURCE: Blood venous                                 COLLECTED:  05/11/13 13:45  ANTIBIOTICS AT COLL.:                                RECEIVED :  05/11/13 15:34  Culture Blood  PRELIM      05/12/13 16:15  05/12/13   No Growth after 1 day/s of incubation.            Recent CBC   Recent Labs   [facility] 05/13/13 0557    RBC 3.82*    HGB 11.3*    HCT 34.9*    MCV 91.4    MCH 29.6    MCHC 32.4    RDW 14    MPV 13.4*    LABPLAT --       Rads:   Radiological Procedure reviewed.    Signed by: Christell Faith

## 2013-05-13 NOTE — Progress Notes (Signed)
Case Management Initial Discharge Planning Assessment    Psychosocial/Demographic Information   Name of interviewee: Pt   Healthcare Decision Maker (HDM) (if other than the patient) Pt   HDM - Relationship to Patient    HDM - Contact Information    Pt lives with Parents   Type of residence where patient lives Home   DME / Assistive devices at home None   Prior level of functioning (ambulation & ADLs) Ambulatory    Correct Insurance listed on face sheet - verified with the patient/HDM Yes   Any additional emergency contacts? No   Does the patient have an Advance Directive? No   Is the POA/Guardianship documentation in shadow chart? (if applicable)     Source of Income (SSDI. SSI. Social Security, pension, employment, Catering manager) Employment part time and full time Research scientist (life sciences) in Place  Name of Primary Care Physician verified in patient banner (update in patient banner if not listed).  If no PCP, call [phone] with patient in room to get them connected with a PCP. Dr. Pricilla Holm (pediatrician). Pt would like to change PCP- Clinical research associate provided phone # to [facility]   What DME does the patient currently own? (rolling walker, hospital bed, home O2, BiPAP/CPAP, bedside commode, cane, hoyer lift) None   Has the patient been to an Acute Rehab or SNF in the past?  If so, where? Na   Does the patient currently have home health or hospice/palliative services in place?  If so, list agency name. Na   Does the patient already have community dialysis set up?  If so, where? Na     Readmission Assessment  Current LACE Score Reviewed? Yes/No 5   Is this patient an inpatient to inpatient 30 day readmission?    Does the patient have difficulty obtaining his/her medications? No   Does the patient have difficulty getting to his/her physician appointments? No     Anticipated Discharge Plan  Discussed Anticipated Discharge Date and Discharge Disposition Possibilities with: _X__Patient   ___Healthcare Decision  Maker  ___Other   Anticipated Disposition: Option A Return Home   Anticipated Disposition: Option B    Who will transport the patient when ready for discharge? (offer wheelchair Zenaida Niece service if patient/family cannot identify transport plan) Parents    If applicable, were SNF or Hospice choices provided? Na    Palliative Care Consult needed? (if yes, contact attending MD)    Na   Geriatrics Consult needed? (if yes, contact attending MD) Na   Elderlink Referral needed? (if yes, refer through [facility]) Na   TCM Referral needed? (if yes, refer through [facility]) Na   PACE Referral needed? (if yes, refer through [facility]) Na   Are there any potential barriers to discharge identified?      ___Lack of Insurance  ___Lack of Health Literacy  ___Undocumented  ___No resources for meds or medical care  ___Transportation issues  ___Language/Cultural/Spiritual  ___Cognitive level / capacity  ___Psychiatric or substance abuse issues  ___Co-morbidities  ___Potential abuse or neglect  ___Safety issues in the home  ___Potential placement issues  ___Pt / family disagreement with d/c plan  ___Lack of family support  ___Lack of extended family / friend support  ___Home Estate agent (multi-level home/access          issues)   ___ NONE     Inpatient Medicare/Medicare HMO Patients Only  Was an initial IMM signed within 24 hours of admission?  (Look in Media Tab, Documents Table or Shadow  Chart)      Uninsured Patients Only  If patient has a spouse, does your spouse have insurance under his/her place of employment?    Did the patient sign up for insurance through the Affordable Care Act?        Pt from home and will return home once medically cleared. No CM needs at this time.

## 2013-05-14 LAB — COMPREHENSIVE METABOLIC PANEL
ALT: 25 U/L (ref 0–55)
AST (SGOT): 24 U/L (ref 5–34)
Albumin/Globulin Ratio: 0.4 — ABNORMAL LOW (ref 0.9–2.2)
Albumin: 2.3 g/dL — ABNORMAL LOW (ref 3.5–5.0)
Alkaline Phosphatase: 43 U/L (ref 40–150)
Anion Gap: 8 (ref 5.0–15.0)
BUN: 8 mg/dL (ref 7.0–19.0)
Bilirubin, Total: 0.1 mg/dL — ABNORMAL LOW (ref 0.2–1.2)
CO2: 25 mEq/L (ref 22–29)
Calcium: 8.4 mg/dL — ABNORMAL LOW (ref 8.5–10.5)
Chloride: 104 mEq/L (ref 98–107)
Creatinine: 0.7 mg/dL (ref 0.6–1.0)
Globulin: 6.1 g/dL — ABNORMAL HIGH (ref 2.0–3.6)
Glucose: 92 mg/dL (ref 70–100)
Potassium: 4.3 mEq/L (ref 3.5–5.1)
Protein, Total: 8.4 g/dL — ABNORMAL HIGH (ref 6.0–8.3)
Sodium: 137 mEq/L (ref 136–145)

## 2013-05-14 LAB — CBC WITH MANUAL DIFFERENTIAL
Band Neutrophils Absolute: 0.12 (ref 0.00–1.00)
Band Neutrophils: 1 %
Basophils Absolute Manual: 0 (ref 0.00–0.20)
Basophils Manual: 0 %
Cell Morphology: NORMAL
Eosinophils Absolute Manual: 0 (ref 0.00–0.70)
Eosinophils Manual: 0 %
Hematocrit: 35.3 % — ABNORMAL LOW (ref 37.0–47.0)
Hgb: 11.7 g/dL — ABNORMAL LOW (ref 12.0–16.0)
Lymphocytes Absolute Manual: 1.21 (ref 0.50–4.40)
Lymphocytes Manual: 10 %
MCH: 30.1 pg (ref 28.0–32.0)
MCHC: 33.1 g/dL (ref 32.0–36.0)
MCV: 90.7 fL (ref 80.0–100.0)
MPV: 13.2 fL — ABNORMAL HIGH (ref 9.4–12.3)
Monocytes Absolute: 0.12 (ref 0.00–1.20)
Monocytes Manual: 1 %
Neutrophils Absolute Manual: 10.67 — ABNORMAL HIGH (ref 1.80–8.10)
Nucleated RBC: 0 (ref 0–1)
Platelets: 384 (ref 140–400)
RBC: 3.89 — ABNORMAL LOW (ref 4.20–5.40)
RDW: 14 % (ref 12–15)
Segmented Neutrophils: 88 %
WBC: 12.12 — ABNORMAL HIGH (ref 3.50–10.80)

## 2013-05-14 LAB — HEMOLYSIS INDEX: Hemolysis Index: 2 (ref 0–18)

## 2013-05-14 LAB — GFR: EGFR: >60

## 2013-05-14 NOTE — Plan of Care (Signed)
Problem: Infection/Potential for Infection  Goal: Free from infection  Outcome: Progressing  Pt afebrile ,had last iv antibiotic and has been discharged home. Discharge instructions given and understood. Left accompanied by mother and transporter with all belongings.Flu shot also administered prior to discharge, no side effects noted.

## 2013-05-14 NOTE — Progress Notes (Signed)
Infectious Diseases & Tropical Medicine  Progress Note    05/14/2013   Kyri Shader [id number] is a 22 y.o. female,       Assessment:     H/O of ITP s/p splenectomy  Thrombocytopenia improving  Pharyngitis improving  No evidence of rheumatic heart disease or RF  Patient is on chronic penicillin   Clinically improved      Plan:      Likely discharge today   Augmentin X 7 days   Prescription in the chart   Side effects discussed with the patient   D/C  Chronic penicillin therapy   Maintain good oral hygiene   D/W patient and father in details   D/W Dr. Georga Kaufmann   D/W Dr. Janee Morn    ROS:     General:  no fever, no chills, no rigor, awake and alert, feeling better, no new issues   HEENT: no neck pain, c/o throat pain but improved  Endocrine: no fatigue   Respiratory: no cough, shortness of breath, or wheezing   Cardiovascular: no chest pain   Gastrointestinal: no abdominal pain,no N/V/D  Genito-Urinary: no dysuria, trouble voiding, or hematuria   Musculoskeletal: no edema  Neurological: no generalized weakness   Dermatological: no rash, no ulcer    Physical Examination:     Blood pressure 112/58, pulse 55, temperature 96.8 F (36 C), temperature source Oral, resp. rate 16, height 1.651 m (5\' 5" ), weight 49.896 kg (110 lb), SpO2 99.00%.     General Appearance: Comfortable, and in no acute distress. Awake and alert, feeling better, ambulating     HEENT: Pupils are equal, round, and reactive to light. Minimal erythema of the pharynx improving, no membranes, tongue hematoma    Lungs:  CTA   Heart: RRR   Chest: Symmetric chest wall expansion.    Abdomen: soft ,non tender,no hepatosplenomegaly   Neurological: No focal deficit   Extremities: No edema    Laboratory And Diagnostic Studies:     Recent Labs   [facility] 05/14/13 0504 05/13/13 0557    WBC 12.12* 14.53*    HGB 11.7* 11.3*    HCT 35.3* 34.9*    PLT 384 212     Recent Labs   Basename 05/14/13 0504 05/13/13 0557    NA 137 138    K 4.3  3.9    CL 104 108*    CO2 25 22    BUN 8.0 7.0    CREAT 0.7 0.7    GLU 92 79    CA 8.4* 8.1*     Recent Labs   Basename 05/14/13 0504 05/13/13 0557    AST 24 19    ALT 25 18    ALKPHOS 43 38*    PROT 8.4* 8.5*    ALB 2.3* 2.2*       Current Meds:      Scheduled Meds: PRN Meds:           cefTRIAXone 1 g Intravenous Q24H SCH   cetirizine 10 mg Oral Daily   fluticasone 1 spray Each Nare Daily   predniSONE 60 mg Oral QAM W/BREAKFAST   pseudoephedrine 120 mg Oral Q12H SCH       Continuous Infusions:       . [DISCONTINUED] sodium chloride 100 mL/hr at 05/13/13 1143         acetaminophen 650 mg Q4H PRN   influenza 0.5 mL Prior to discharge         Kaj Vasil A. Janalyn Rouse, M.D.  05/14/2013  9:07 AM

## 2013-05-14 NOTE — Discharge Instructions (Signed)
Pharyngitis: Strep [Confirmed]    Your test for strep throat was positive. Strep throat is a contagious illness. It is spread by coughing, kissing or by touching others after touching your mouth or nose. Symptoms include throat pain which is worse with swallowing, aching all over, headache and fever. You will be treated with an antibiotic which should make you start to feel better within 1-2 days.  Home Care:   Rest at home and drink plenty of fluids to avoid dehydration.   No school or work for the first two days on antibiotics. You will not be contagious after this time and if you are feeling better, you can return to school or work.   Take your antibiotics for a full 10 days, even if you feel better after the first few days of treatment. This is very important to prevent heart or kidney disease that can result as a complication of untreated strep throat infection.   Children: Use acetaminophen (Tylenol) for fever, fussiness or discomfort. In infants over six months of age, you may use ibuprofen (Children's Motrin) instead of Tylenol. [NOTE: If your child has chronic liver or kidney disease or ever had a stomach ulcer or GI bleeding, talk with your doctor before using these medicines.] (Aspirin should never be used in anyone under 80 years of age who is ill with a fever. It may cause severe liver damage.)Adults: You may use acetaminophen (Tylenol) or ibuprofen (Motrin, Advil) to control pain or fever, unless another medicine was prescribed for this. [NOTE: If you have chronic liver or kidney disease or ever had a stomach ulcer or GI bleeding, talk with your doctor before using these medicines.]   Throat lozenges or sprays (Chloraseptic and others) will reduce pain. Gargling with warm salt water will also reduce throat pain. Dissolve 1/2 teaspoon of salt in 1 glass of warm water. This is especially useful just before meals.  Follow Up  with your doctor or as directed by our staff if you are not improving  over the next week.  Get Prompt Medical Attention  if any of the following occur:   Fever of 100.35F (38C) oral or higher, not better with fever medication   New or worsening ear pain, sinus pain or headache   Painful lumps in the back of your neck   Unable to swallow liquids or open your mouth wide due to throat pain   Trouble breathing or noisy breathing   Muffled voice   New rash   [address], [place], [place] [address]. All rights reserved. This information is not intended as a substitute for professional medical care. Always follow your healthcare professional's instructions.      Viral Pharyngitis (Sore Throat)    Your throat pain is due to an infection called "Viral Pharyngitis", commonly known as "Sore Throat". This is a contagious illness. It is spread through the air by coughing, kissing or by touching others after touching your mouth or nose. Symptoms include throat pain worse with swallowing, aching all over, headache and fever. Unlike strep throat, which is a bacterial infection, this illness does not require treatment with an antibiotic.  Home Care:  1. If your symptoms are severe, rest at home for the first 2-3 days.  2. Children: Use acetaminophen (Tylenol) for fever, fussiness or discomfort. In infants over six months of age, you may use ibuprofen (Children's Motrin) instead of Tylenol. [NOTE: If your child has chronic liver or kidney disease or ever had a  stomach ulcer or GI bleeding, talk with your child's doctor before using these medicines.] (Aspirin should never be used in anyone under 34 years of age who is ill with a fever. It may cause severe liver damage.)  Adults: You may use acetaminophen (Tylenol) or ibuprofen (Motrin, Advil) to control pain or fever, unless another medicine was prescribed. [NOTE: If you have chronic liver or kidney disease or ever had a stomach ulcer or GI bleeding, talk with your doctor before using these medicines.]  3. Throat  lozenges or sprays (Chloraseptic and others) will reduce pain. Gargling with warm salt water will also reduce throat pain. Dissolve 1/2 teaspoon of salt in 1 glass of warm water. This is especially useful just before meals.  Follow Up  with your doctor or as directed by our staff if you are not improving over the next week.  Get Prompt Medical Attention  if any of the following occur:   Fever over 100.26F (38.0C) oral, or over 101.26F (38.6C) rectal for more than three days   New or worsening ear pain, sinus pain or headache   Painful lumps in the back of your neck   Unable to swallow liquids or open your mouth wide due to throat pain   Trouble breathing or noisy breathing   Muffled voice   New rash   [address], [address], [place], [place] [address]. All rights reserved. This information is not intended as a substitute for professional medical care. Always follow your healthcare professional's instructions.      Viral Pharyngitis (Sore Throat)    Your throat pain is due to an infection called "Viral Pharyngitis", commonly known as "Sore Throat". This is a contagious illness. It is spread through the air by coughing, kissing or by touching others after touching your mouth or nose. Symptoms include throat pain worse with swallowing, aching all over, headache and fever. Unlike strep throat, which is a bacterial infection, this illness does not require treatment with an antibiotic.  Home Care:  4. If your symptoms are severe, rest at home for the first 2-3 days.  5. Children: Use acetaminophen (Tylenol) for fever, fussiness or discomfort. In infants over six months of age, you may use ibuprofen (Children's Motrin) instead of Tylenol. [NOTE: If your child has chronic liver or kidney disease or ever had a stomach ulcer or GI bleeding, talk with your child's doctor before using these medicines.] (Aspirin should never be used in anyone under 4 years of age who is ill with a fever. It may cause  severe liver damage.)  Adults: You may use acetaminophen (Tylenol) or ibuprofen (Motrin, Advil) to control pain or fever, unless another medicine was prescribed. [NOTE: If you have chronic liver or kidney disease or ever had a stomach ulcer or GI bleeding, talk with your doctor before using these medicines.]  6. Throat lozenges or sprays (Chloraseptic and others) will reduce pain. Gargling with warm salt water will also reduce throat pain. Dissolve 1/2 teaspoon of salt in 1 glass of warm water. This is especially useful just before meals.  Follow Up  with your doctor or as directed by our staff if you are not improving over the next week.  Get Prompt Medical Attention  if any of the following occur:   Fever over 100.26F (38.0C) oral, or over 101.26F (38.6C) rectal for more than three days   New or worsening ear pain, sinus pain or headache   Painful lumps in the back of your neck  Unable to swallow liquids or open your mouth wide due to throat pain   Trouble breathing or noisy breathing   Muffled voice   New rash   [address], [address] [place], [place] [address]. All rights reserved. This information is not intended as a substitute for professional medical care. Always follow your healthcare professional's instructionDate Time: 05/14/2013 10:35 AM  Attending Physician: Tona Sensing, MD    Date of Admission:   05/11/2013    Reason for Admission:   Pharyngitis [462]  Thrombocytopenia [287.5]  Pharyngitis    Follow up:        Neurology (If Stroke): ______________________  Phone:______________________    Other:___________________________________   Phone: ______________________    If you are taking Warfarin, please follow up with (health professional/clinic) ____****________ on ____****_________to have your PT/INR blood test checked.          Medications:    Your medications have been listed for you on the Medication Reconciliation Discharge Home List. Please bring a copy of all  discharge instructions, including your Medication Reconciliation Discharge Home List when you visit your physician.      Continue taking all medications even if you feel well, unless otherwise instructed by physician.    Do not take any over-the-counter medications or herbal supplements without checking with your pharmacist or doctor.     Activity:    Rise slowly from a sitting or lying position. Increase activity slowly, unless otherwise instructed by physician.    Perform exercises as desginated by Therapist and Physician.    In the event of severe shortness of breath or chest discomfort, call 911. Do NOT drive to the hospital.    Speak with your physician regarding specific driving and/or work restrictions.    Diet:     As tolerated    If you have special diet orders, you have been given printed diet instructions.   ________________________________________________________________________    Tobacco Cessation Counseling:  If you are currently a tobacco user or have used tobacco within the last 12 months, we have provided you with written Tobacco Cessation Counseling.  ________________________________________________________________________    Heart Failure Education:  If you have a diagnosis of a Heart Failure, we have provided you with written Heart Failure Education.     Weigh yourself once a day at the same time. Record and bring the weight record to your next physician appointment.     Call your doctor if you gain more than 3 pounds in one day or 5 pounds in one week, or if you experience shortness of breath, leg swelling and/or chest discomfort.     Enroll in the [facility] Tel-Assurance Program, a heart failure patient support program. Call [phone] for additional details.   ________________________________________________________________________    Diamond Nickel Education:    Call 911 for:    Sudden numbness or weakness of the face    Sudden numbness of the arm or leg especially one side of the body     Sudden confusion, trouble speaking or understanding    Sudden trouble seeing in one or both eyes    Sudden trouble walking or dizziness, loss of balance or coordination        For promotion of your health, we have provided you with personalized written education on risk factors specific to your diagnosis, including but not limited to:    High Blood Pressure    High Cholesterol    Atrial Fibrillation    Overweight    Diabetes  Smoking         Vaccinations  Pneumonia Vaccine Received on:na  Flu Vaccine Received CB:[id number]             Treatments/Special Instructions:              Signed by: Orson Ape, RN    I HAVE RECEIVED AND UNDERSTAND THESE DISCHARGE INSTRUCTIONS.           s.

## 2013-05-14 NOTE — Plan of Care (Signed)
Problem: Infection/Potential for Infection  Goal: Free from infection  Outcome: Progressing  Patient A/O x 4.  Afebrile and other vitals stable as well.  BP and heart rate have been running low.  Platelets now WNL: 212.  No bleeding noted.  No c/o pain.  She still has some nasal congestion.  Will continue to monitor vitals, labs, condition.

## 2013-05-17 ENCOUNTER — Ambulatory Visit (INDEPENDENT_AMBULATORY_CARE_PROVIDER_SITE_OTHER): Payer: BC Managed Care – PPO | Admitting: Hematology & Oncology

## 2013-05-17 ENCOUNTER — Encounter (INDEPENDENT_AMBULATORY_CARE_PROVIDER_SITE_OTHER): Payer: Self-pay | Admitting: Hematology & Oncology

## 2013-05-17 VITALS — BP 110/69 | HR 76 | Ht 64.5 in | Wt 103.0 lb

## 2013-05-17 DIAGNOSIS — D693 Immune thrombocytopenic purpura: Secondary | ICD-10-CM | POA: Insufficient documentation

## 2013-05-17 LAB — POCT AMB CBC W/ AUTO DIFF
Granulocytes Abs: 11.4
Granulocytes: 56.4
Hematocrit POCT: 40.7
Hgb POCT: 13.1 g/dL (ref 12–15.5)
Lymphocytes Absolute POCT: 7.7
Lymphocytes Automated: 38.1
MCH, POC: 29.4
MCHC: 32.2
MCV: 91.4
MPV: 10.1
Monocytes Absolute POCT: 1.1
Monocytes: 5.5
Platelet Count POCT: 726
RBC: 4.45
RDW POCT: 13.3
WBC: 20.2

## 2013-05-17 NOTE — Progress Notes (Signed)
Subjective:       Patient ID: Grace Mccarthy is a 22 y.o. female.    HPI  Grace Mccarthy is a 22 y.o. female who presented to the hospital on 05/11/2013 with sore throat and petechiae and nose bleed. She has a history of ITP since age 35, she was seeing a hematologist in [place] during childhood. 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 She reports multiple flares of ITP. All occuring in setting of viral infections. Pt reports that her pediatrician Dr Clement Sayres has been managing her disease for the most part. She came in to ER with sore throat, nose bleed, hemoptysis, fever of 101.75F at home. She was treated for pharyngitis. She was started on IVIG and steroids. Noteed that her petechiae resolved with IVIG and steroids.    She was discharged on Prednisone 60 mg daily, tolerating well. She has 2 more days of antibiotics. She has no bleeding, bruising or petechiae. She notes that the lesion on the right side of her tongue feels larger when she wakes up in the morning to the point that she has trouble swallowing. Size goes down over the next several hours.       The following portions of the patient's history were reviewed and updated as appropriate: allergies, current medications, past family history, past medical history, past social history, past surgical history and problem list.    Review of Systems   Constitutional: Negative for activity change, appetite change and fatigue.   HENT: Positive for mouth sores. Negative for nosebleeds.    Eyes: Negative for visual disturbance.   Respiratory: Negative for cough, chest tightness and shortness of breath.    Cardiovascular: Negative for palpitations and leg swelling.   Gastrointestinal: Negative for abdominal distention.   Genitourinary: Negative for hematuria and menstrual problem.   Musculoskeletal: Negative for arthralgias.    Neurological: Negative for dizziness, light-headedness and headaches.   Hematological: Negative for adenopathy. Does not bruise/bleed easily.   Psychiatric/Behavioral: Negative for agitation.           Objective:    Physical Exam   Constitutional: She is oriented to person, place, and time. She appears well-developed and well-nourished.   HENT:   Head: Normocephalic.   Mouth/Throat: No oropharyngeal exudate.        1.5 cm lesion on right outer aspect of tongue, non tender     Eyes: Conjunctivae normal are normal.   Neck: Neck supple. No thyromegaly present.   Cardiovascular: Normal rate.    Pulmonary/Chest: No respiratory distress. She exhibits no tenderness.   Abdominal: She exhibits no distension.   Musculoskeletal: She exhibits no edema and no tenderness.   Neurological: She is oriented to person, place, and time.   Skin: Skin is warm and dry.           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  RTC one week    Received flu shot

## 2013-05-18 NOTE — Discharge Summary (Signed)
Discharge Date: 05/14/2013     ATTENDING PHYSICIAN:  Bing Neighbors, MD     DISCHARGE DIAGNOSES:  1.  Pharyngitis:  2.  Severe thrombocytopenia, improved.  3.  History of idiopathic thrombocytopenic purpura.     DISCHARGE MEDICATIONS:  See medication reconciliation report.     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  She also  has been experiencing fever.  She was taking PENICILLIN as an outpatient  without much relief.  Initial blood work revealed evidence of leukocytosis  with a presenting white count of 12,000.  There was also evidence of severe  thrombocytopenia with a presenting platelet count of 21,000.  A Rapid Strep  test was done which was negative.  Moreover, mononucleosis screen was also  done, which was negative.  The patient was admitted to the hospital.  She  was treated with intravenous antibiotics.  She was also seen in  consultation by hematology and was started on IVIG.  She was also seen in  consultation by infectious disease services.  With continuous medical  management, her symptoms did start to improve.  She was placed on high-dose  steroids by hematology for underlying thrombocytopenia.  At the time of  discharge, the patient was feeling much better.  She was discharged home in  a stable condition.  She was requested to follow up at hematology within 1  week.  She will also follow up with myself within 2 weeks; all throughout  the hospitalization, her family was updated.     The patient did also receive a flu shot at the time of discharge.           D:  05/17/2013 18:55 PM by Dr. [name] ([id number])  T:  05/18/2013 14:35 PM by       Everlean Cherry: [id number]) (Doc ID: [id number])

## 2013-05-24 ENCOUNTER — Telehealth (INDEPENDENT_AMBULATORY_CARE_PROVIDER_SITE_OTHER): Payer: Self-pay | Admitting: Hematology & Oncology

## 2013-05-24 NOTE — Telephone Encounter (Signed)
Grace Mccarthy is tapering off the prednisone -she says she is losing weight and feels real anxious --would like you to call her   [phone]

## 2013-05-27 ENCOUNTER — Ambulatory Visit (INDEPENDENT_AMBULATORY_CARE_PROVIDER_SITE_OTHER): Payer: BC Managed Care – PPO | Admitting: Hematology & Oncology

## 2013-05-27 ENCOUNTER — Encounter (INDEPENDENT_AMBULATORY_CARE_PROVIDER_SITE_OTHER): Payer: Self-pay | Admitting: Hematology & Oncology

## 2013-05-27 VITALS — Wt 100.0 lb

## 2013-05-27 DIAGNOSIS — D693 Immune thrombocytopenic purpura: Secondary | ICD-10-CM

## 2013-05-27 DIAGNOSIS — Z029 Encounter for administrative examinations, unspecified: Secondary | ICD-10-CM

## 2013-05-27 LAB — POCT AMB CBC W/ AUTO DIFF
Granulocytes Abs: 6.9
Granulocytes: 52.2
Hematocrit POCT: 39.8
Hgb POCT: 13.1 g/dL (ref 12–15.5)
Lymphocytes Absolute POCT: 5.7
Lymphocytes Automated: 42.6
MCH, POC: 30.1
MCHC: 32.9
MCV: 91.6
MPV: 9.8
Monocytes Absolute POCT: 0.7
Monocytes: 5.2
Platelet Count POCT: 567
RBC: 4.34
RDW POCT: 13.5
WBC: 13.3

## 2013-05-27 NOTE — Addendum Note (Signed)
Addended by: Almyra Brace on: 05/27/2013 04:09 PM     Modules accepted: Orders

## 2013-05-27 NOTE — Progress Notes (Signed)
Subjective:       Patient ID: Grace Mccarthy is a 22 y.o. female.    HPI  Grace Mccarthy is a 22 y.o. female who presented to the hospital on 05/11/2013 with sore throat and petechiae and nose bleed. She has a history of ITP since age 38, she was seeing a hematologist in [place] during childhood. 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 She reports multiple flares of ITP, all occuring in setting of viral infections. Pt reports that her pediatrician Dr Clement Sayres has been managing her disease for the most part. She came in to ER with sore throat, nose bleed, hemoptysis, fever of 101.57F at home. She was treated for pharyngitis. She was started on IVIG and steroids. Noted that her petechiae resolved with IVIG and steroids.   She was discharged on Prednisone 60 mg daily. She has no bleeding, bruising or petechiae.     She also  Notes a chronic mass/lesion on the right side of her tongue- feels larger when she wakes up in the morning to the point that she has trouble swallowing. Size goes down over the next several hours.     She is on a prednisone taper. Earlier this week she called c/o fatigue, continued weight loss (over one month) and sluggishness. I told her to slow down the taper and continue on 20mg  of prednisone until her counts are rechecked.  No appetite since leaving hospital. Tired, worn out. Studying for finals and also recently broke up with her boyfriend. She also recently started working with a Psychologist, educational.     The following portions of the patient's history were reviewed and updated as appropriate: allergies, current medications, past family history, past medical history, past social history, past surgical history and problem list.    Review of Systems   Constitutional: Positive for fatigue and unexpected weight change.   HENT: Negative for mouth sores and nosebleeds.     Eyes: Negative for visual disturbance.   Gastrointestinal: Negative for nausea.   Skin: Negative for rash.   Hematological: Does not bruise/bleed easily.           Objective:    Physical Exam   Constitutional:        Appears thin           Assessment:       22 year old with ITP s/p splenectomy, recent flare in setting of pharyngitis. Brisk platelet response to IVIG and steroids, now on a taper. Notes weight loss (suspect due to drop in appetite from steroid taper and personal stress ) and fatigue      Plan:       -continue steroid taper, plt count today is 567K, hgb is 13.1  Recheck counts in one week

## 2013-06-03 ENCOUNTER — Ambulatory Visit (INDEPENDENT_AMBULATORY_CARE_PROVIDER_SITE_OTHER): Payer: BC Managed Care – PPO | Admitting: Hematology & Oncology

## 2013-06-07 ENCOUNTER — Other Ambulatory Visit (INDEPENDENT_AMBULATORY_CARE_PROVIDER_SITE_OTHER): Payer: Self-pay | Admitting: Hematology & Oncology

## 2013-06-10 ENCOUNTER — Ambulatory Visit (INDEPENDENT_AMBULATORY_CARE_PROVIDER_SITE_OTHER): Payer: BC Managed Care – PPO | Admitting: Hematology & Oncology

## 2013-06-10 ENCOUNTER — Encounter (INDEPENDENT_AMBULATORY_CARE_PROVIDER_SITE_OTHER): Payer: Self-pay | Admitting: Hematology & Oncology

## 2013-06-10 VITALS — BP 117/81 | HR 77 | Wt 100.0 lb

## 2013-06-10 LAB — POCT AMB CBC W/ AUTO DIFF
Granulocytes Abs: 7.8
Granulocytes: 71.3
Hematocrit POCT: 40.9
Hgb POCT: 13.9 g/dL (ref 12–15.5)
Lymphocytes Absolute POCT: 2.4
Lymphocytes Automated: 21.9
MCH, POC: 31.1
MCHC: 34.1
MCV: 91.4
MPV: 9.7
Monocytes Absolute POCT: 0.7
Monocytes: 6.8
Platelet Count POCT: 96
RBC: 4.47
RDW POCT: 14.2
WBC: 11

## 2013-06-11 NOTE — Progress Notes (Signed)
Subjective:       Patient ID: Grace Mccarthy is a 22 y.o. female.    HPI  Grace Mccarthy is a 22 y.o. female who presented to the hospital on 05/11/2013 with sore throat and petechiae and nose bleed. She has a history of ITP since age 68, she was seeing a hematologist in [place] during childhood. 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 She reports multiple flares of ITP, all occuring in setting of viral infections. Pt reports that her pediatrician Dr Clement Sayres has been managing her disease for the most part.     In November 2014 I was consulted to see her in the hospital for management of ITP. she came in to ER with sore throat, nose bleed, hemoptysis, fever of 101.7F at home. She was treated for pharyngitis. She was started on IVIG and steroids. Noted that her petechiae resolved with IVIG and steroids.   She was discharged on Prednisone 60 mg daily. She has no bleeding, bruising or petechiae.   She also cotes a chronic mass/lesion on the right side of her tongue- feels larger when she wakes up in the morning to the point that she has trouble swallowing. Size goes down over the next several hours. Also notes new smaller satellite lesion near tip of tongue. I referred her to ENT but she has not gone in yet.    While on prednisone taper, she c/o fatigue, continued weight loss (over one month) and sluggishness. No appetite since leaving hospital. Tired, worn out. She is off of prednisone, plt count today 94K        The following portions of the patient's history were reviewed and updated as appropriate: allergies, current medications, past family history, past medical history, past social history, past surgical history and problem list.    Review of Systems   Constitutional: Positive for fatigue and unexpected weight change. Negative for fever, activity change and appetite  change.   HENT: Negative for mouth sores and nosebleeds.         2 lesions on tongue   Eyes: Negative for visual disturbance.   Respiratory: Negative for shortness of breath.    Cardiovascular: Negative for leg swelling.   Genitourinary: Negative for dysuria and menstrual problem.   Musculoskeletal: Negative for arthralgias and myalgias.   Skin: Negative for rash.   Neurological: Negative for weakness, light-headedness and headaches.   Hematological: Does not bruise/bleed easily.           Objective:    Physical Exam   Constitutional: She appears well-developed and well-nourished.   Skin: No rash noted.           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 She is not sure if she received Rituxan in the past      Plan:       -monitor off of steroids, recheck plt in one week to watch for drastic drop  -her baseline plt count is in the 40-60K  -await old records from Dr Pricilla Holm, request placed  RTC 1 month for f/u

## 2013-06-15 ENCOUNTER — Ambulatory Visit (INDEPENDENT_AMBULATORY_CARE_PROVIDER_SITE_OTHER): Payer: BC Managed Care – PPO

## 2013-06-21 ENCOUNTER — Other Ambulatory Visit (INDEPENDENT_AMBULATORY_CARE_PROVIDER_SITE_OTHER): Payer: Self-pay | Admitting: Hematology & Oncology

## 2013-06-21 DIAGNOSIS — D693 Immune thrombocytopenic purpura: Secondary | ICD-10-CM

## 2013-06-22 ENCOUNTER — Encounter (INDEPENDENT_AMBULATORY_CARE_PROVIDER_SITE_OTHER): Payer: Self-pay | Admitting: Hematology & Oncology

## 2013-06-28 ENCOUNTER — Other Ambulatory Visit (INDEPENDENT_AMBULATORY_CARE_PROVIDER_SITE_OTHER): Payer: Self-pay | Admitting: Hematology & Oncology

## 2013-06-28 DIAGNOSIS — D693 Immune thrombocytopenic purpura: Secondary | ICD-10-CM

## 2013-07-05 ENCOUNTER — Other Ambulatory Visit (INDEPENDENT_AMBULATORY_CARE_PROVIDER_SITE_OTHER): Payer: Self-pay | Admitting: Hematology & Oncology

## 2013-07-05 DIAGNOSIS — D693 Immune thrombocytopenic purpura: Secondary | ICD-10-CM

## 2013-07-12 ENCOUNTER — Ambulatory Visit (INDEPENDENT_AMBULATORY_CARE_PROVIDER_SITE_OTHER): Payer: BC Managed Care – PPO | Admitting: Hematology & Oncology

## 2013-07-14 ENCOUNTER — Encounter (INDEPENDENT_AMBULATORY_CARE_PROVIDER_SITE_OTHER): Payer: Self-pay | Admitting: Hematology & Oncology

## 2013-07-14 ENCOUNTER — Ambulatory Visit (INDEPENDENT_AMBULATORY_CARE_PROVIDER_SITE_OTHER): Payer: BC Managed Care – PPO | Admitting: Hematology & Oncology

## 2013-07-14 VITALS — BP 106/69 | HR 80 | Wt 106.0 lb

## 2013-07-14 DIAGNOSIS — D693 Immune thrombocytopenic purpura: Secondary | ICD-10-CM

## 2013-07-14 DIAGNOSIS — Z029 Encounter for administrative examinations, unspecified: Secondary | ICD-10-CM

## 2013-07-14 LAB — POCT AMB CBC W/ AUTO DIFF
Granulocytes Abs: 4.1
Granulocytes: 45.6
Hematocrit POCT: 46.1
Hgb POCT: 14.3 g/dL (ref 12–15.5)
Lymphocytes Absolute POCT: 4.1
Lymphocytes Automated: 45.9
MCH, POC: 28.8
MCHC: 31
MCV: 92.8
MPV: 8.3
Monocytes Absolute POCT: 0.8
Monocytes: 8.5
Platelet Count POCT: 41
RBC: 4.97
RDW POCT: 13.8
WBC: 8.9

## 2013-07-14 NOTE — Progress Notes (Signed)
Subjective:       Patient ID: Grace Mccarthy is a 23 y.o. female.    HPI  Grace Mccarthy is a 23 y.o. female who presented to the hospital on 05/11/2013 with sore throat and petechiae and nose bleed. She has a history of ITP since age 22, she was [place]  during childhood. 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 She reports multiple flares of ITP, all occuring in setting of viral infections. Grace Mccarthy reports that her Grace Mccarthy has been managing her disease for the most part.   In November 2014 I was consulted to see her in the hospital for management of ITP. she came in to ER with sore throat, nose bleed, hemoptysis, fever of 101.23F at home. She was treated for pharyngitis. She was started on IVIG and steroids. Noted that her petechiae resolved with IVIG and steroids.   She was discharged on Prednisone 60 mg daily. She has no bleeding, bruising or petechiae.     Bruises easily while on period. Periods not heavy. Has allergies and congestion now. No petechiae.   The following portions of the patient's history were reviewed and updated as appropriate: allergies, current medications, past family history, past medical history, past social history, past surgical history and problem list.    Review of Systems   Constitutional: Negative for fever, activity change, appetite change and fatigue.   HENT: Negative for nosebleeds.    Eyes: Negative for visual disturbance.   Respiratory: Negative for choking, chest tightness and shortness of breath.    Cardiovascular: Negative for chest pain, palpitations and leg swelling.   Gastrointestinal: Negative for abdominal pain and blood in stool.   Genitourinary: Negative for hematuria.   Musculoskeletal: Negative for arthralgias and myalgias.   Neurological: Negative for facial asymmetry and headaches.    Hematological: Bruises/bleeds easily.   Psychiatric/Behavioral: Negative for agitation.           Objective:    Physical Exam   Constitutional: She appears well-developed and well-nourished.   Musculoskeletal: She exhibits no edema.   Skin: No rash noted.   Psychiatric: She has a normal mood and affect. Her behavior is normal.           Assessment:       23 year old with long standing ITP s/p splenectomy in childhood, very responsive to steroids and IVIG. She is not sure if she received Rituxan in the past. Plt count todat is 41K  Plan:    -monitor off of steroids, recheck plt in one week to watch for drastic drop   -her baseline plt count is in the 40-60K   -await old records from Dr Pricilla Holm, request placed   RTC 1 month for f/u and one week for CBC  Discussed options of trying Rituxan or Promacta to keep plt higher, however given how young she is concern is for long term toxicity of Promacta

## 2013-07-20 ENCOUNTER — Ambulatory Visit (INDEPENDENT_AMBULATORY_CARE_PROVIDER_SITE_OTHER): Payer: BC Managed Care – PPO

## 2013-07-26 ENCOUNTER — Other Ambulatory Visit (INDEPENDENT_AMBULATORY_CARE_PROVIDER_SITE_OTHER): Payer: Self-pay | Admitting: Hematology & Oncology

## 2013-07-26 DIAGNOSIS — D693 Immune thrombocytopenic purpura: Secondary | ICD-10-CM

## 2013-08-02 ENCOUNTER — Other Ambulatory Visit (INDEPENDENT_AMBULATORY_CARE_PROVIDER_SITE_OTHER): Payer: Self-pay | Admitting: Hematology & Oncology

## 2013-08-02 DIAGNOSIS — D693 Immune thrombocytopenic purpura: Secondary | ICD-10-CM

## 2013-08-09 ENCOUNTER — Other Ambulatory Visit (INDEPENDENT_AMBULATORY_CARE_PROVIDER_SITE_OTHER): Payer: Self-pay | Admitting: Hematology & Oncology

## 2013-08-09 DIAGNOSIS — D693 Immune thrombocytopenic purpura: Secondary | ICD-10-CM

## 2013-08-16 ENCOUNTER — Other Ambulatory Visit (INDEPENDENT_AMBULATORY_CARE_PROVIDER_SITE_OTHER): Payer: Self-pay | Admitting: Hematology & Oncology

## 2013-08-16 DIAGNOSIS — D693 Immune thrombocytopenic purpura: Secondary | ICD-10-CM

## 2013-08-23 ENCOUNTER — Other Ambulatory Visit (INDEPENDENT_AMBULATORY_CARE_PROVIDER_SITE_OTHER): Payer: Self-pay | Admitting: Hematology & Oncology

## 2013-08-23 DIAGNOSIS — D693 Immune thrombocytopenic purpura: Secondary | ICD-10-CM

## 2013-08-30 ENCOUNTER — Other Ambulatory Visit (INDEPENDENT_AMBULATORY_CARE_PROVIDER_SITE_OTHER): Payer: Self-pay | Admitting: Hematology & Oncology

## 2013-08-30 DIAGNOSIS — D693 Immune thrombocytopenic purpura: Secondary | ICD-10-CM

## 2013-09-06 ENCOUNTER — Other Ambulatory Visit (INDEPENDENT_AMBULATORY_CARE_PROVIDER_SITE_OTHER): Payer: Self-pay | Admitting: Hematology & Oncology

## 2013-09-06 DIAGNOSIS — D693 Immune thrombocytopenic purpura: Secondary | ICD-10-CM

## 2013-09-13 ENCOUNTER — Other Ambulatory Visit (INDEPENDENT_AMBULATORY_CARE_PROVIDER_SITE_OTHER): Payer: Self-pay | Admitting: Hematology & Oncology

## 2013-09-13 DIAGNOSIS — D693 Immune thrombocytopenic purpura: Secondary | ICD-10-CM

## 2013-09-20 ENCOUNTER — Other Ambulatory Visit (INDEPENDENT_AMBULATORY_CARE_PROVIDER_SITE_OTHER): Payer: Self-pay | Admitting: Hematology & Oncology

## 2013-09-20 DIAGNOSIS — D693 Immune thrombocytopenic purpura: Secondary | ICD-10-CM

## 2013-09-27 ENCOUNTER — Other Ambulatory Visit (INDEPENDENT_AMBULATORY_CARE_PROVIDER_SITE_OTHER): Payer: Self-pay | Admitting: Hematology & Oncology

## 2013-09-27 DIAGNOSIS — D693 Immune thrombocytopenic purpura: Secondary | ICD-10-CM

## 2013-10-04 ENCOUNTER — Other Ambulatory Visit (INDEPENDENT_AMBULATORY_CARE_PROVIDER_SITE_OTHER): Payer: Self-pay | Admitting: Hematology & Oncology

## 2013-10-04 DIAGNOSIS — D693 Immune thrombocytopenic purpura: Secondary | ICD-10-CM

## 2013-10-11 ENCOUNTER — Other Ambulatory Visit (INDEPENDENT_AMBULATORY_CARE_PROVIDER_SITE_OTHER): Payer: Self-pay | Admitting: Hematology & Oncology

## 2013-10-11 DIAGNOSIS — D693 Immune thrombocytopenic purpura: Secondary | ICD-10-CM

## 2013-10-18 ENCOUNTER — Other Ambulatory Visit (INDEPENDENT_AMBULATORY_CARE_PROVIDER_SITE_OTHER): Payer: Self-pay | Admitting: Hematology & Oncology

## 2013-10-18 DIAGNOSIS — D693 Immune thrombocytopenic purpura: Secondary | ICD-10-CM

## 2013-10-25 ENCOUNTER — Other Ambulatory Visit (INDEPENDENT_AMBULATORY_CARE_PROVIDER_SITE_OTHER): Payer: Self-pay | Admitting: Hematology & Oncology

## 2013-10-25 DIAGNOSIS — D693 Immune thrombocytopenic purpura: Secondary | ICD-10-CM

## 2013-11-01 ENCOUNTER — Other Ambulatory Visit (INDEPENDENT_AMBULATORY_CARE_PROVIDER_SITE_OTHER): Payer: Self-pay | Admitting: Hematology & Oncology

## 2013-11-01 DIAGNOSIS — D693 Immune thrombocytopenic purpura: Secondary | ICD-10-CM

## 2013-11-08 ENCOUNTER — Other Ambulatory Visit (INDEPENDENT_AMBULATORY_CARE_PROVIDER_SITE_OTHER): Payer: Self-pay | Admitting: Hematology & Oncology

## 2013-11-08 DIAGNOSIS — D693 Immune thrombocytopenic purpura: Secondary | ICD-10-CM

## 2013-11-15 ENCOUNTER — Other Ambulatory Visit (INDEPENDENT_AMBULATORY_CARE_PROVIDER_SITE_OTHER): Payer: Self-pay | Admitting: Hematology & Oncology

## 2013-11-15 DIAGNOSIS — D693 Immune thrombocytopenic purpura: Secondary | ICD-10-CM

## 2013-11-22 ENCOUNTER — Other Ambulatory Visit (INDEPENDENT_AMBULATORY_CARE_PROVIDER_SITE_OTHER): Payer: Self-pay | Admitting: Hematology & Oncology

## 2013-11-22 DIAGNOSIS — D693 Immune thrombocytopenic purpura: Secondary | ICD-10-CM

## 2013-11-29 ENCOUNTER — Other Ambulatory Visit (INDEPENDENT_AMBULATORY_CARE_PROVIDER_SITE_OTHER): Payer: Self-pay | Admitting: Hematology & Oncology

## 2013-11-29 DIAGNOSIS — D693 Immune thrombocytopenic purpura: Secondary | ICD-10-CM

## 2013-12-06 ENCOUNTER — Other Ambulatory Visit (INDEPENDENT_AMBULATORY_CARE_PROVIDER_SITE_OTHER): Payer: Self-pay | Admitting: Hematology & Oncology

## 2013-12-06 DIAGNOSIS — D693 Immune thrombocytopenic purpura: Secondary | ICD-10-CM

## 2013-12-13 ENCOUNTER — Other Ambulatory Visit (INDEPENDENT_AMBULATORY_CARE_PROVIDER_SITE_OTHER): Payer: Self-pay | Admitting: Hematology & Oncology

## 2013-12-13 DIAGNOSIS — D693 Immune thrombocytopenic purpura: Secondary | ICD-10-CM

## 2013-12-20 ENCOUNTER — Other Ambulatory Visit (INDEPENDENT_AMBULATORY_CARE_PROVIDER_SITE_OTHER): Payer: Self-pay | Admitting: Hematology & Oncology

## 2013-12-20 DIAGNOSIS — D693 Immune thrombocytopenic purpura: Secondary | ICD-10-CM

## 2013-12-27 ENCOUNTER — Other Ambulatory Visit (INDEPENDENT_AMBULATORY_CARE_PROVIDER_SITE_OTHER): Payer: Self-pay | Admitting: Hematology & Oncology

## 2013-12-27 DIAGNOSIS — D693 Immune thrombocytopenic purpura: Secondary | ICD-10-CM

## 2014-01-03 ENCOUNTER — Other Ambulatory Visit (INDEPENDENT_AMBULATORY_CARE_PROVIDER_SITE_OTHER): Payer: Self-pay | Admitting: Hematology & Oncology

## 2014-01-03 DIAGNOSIS — D693 Immune thrombocytopenic purpura: Secondary | ICD-10-CM

## 2014-01-10 ENCOUNTER — Other Ambulatory Visit (INDEPENDENT_AMBULATORY_CARE_PROVIDER_SITE_OTHER): Payer: Self-pay | Admitting: Hematology & Oncology

## 2014-01-10 DIAGNOSIS — D693 Immune thrombocytopenic purpura: Secondary | ICD-10-CM

## 2014-01-17 ENCOUNTER — Other Ambulatory Visit (INDEPENDENT_AMBULATORY_CARE_PROVIDER_SITE_OTHER): Payer: Self-pay | Admitting: Hematology & Oncology

## 2014-01-17 DIAGNOSIS — D693 Immune thrombocytopenic purpura: Secondary | ICD-10-CM

## 2014-01-24 ENCOUNTER — Other Ambulatory Visit (INDEPENDENT_AMBULATORY_CARE_PROVIDER_SITE_OTHER): Payer: Self-pay | Admitting: Hematology & Oncology

## 2014-01-24 DIAGNOSIS — D693 Immune thrombocytopenic purpura: Secondary | ICD-10-CM

## 2014-01-31 ENCOUNTER — Other Ambulatory Visit (INDEPENDENT_AMBULATORY_CARE_PROVIDER_SITE_OTHER): Payer: Self-pay | Admitting: Hematology & Oncology

## 2014-01-31 DIAGNOSIS — D693 Immune thrombocytopenic purpura: Secondary | ICD-10-CM

## 2014-02-07 ENCOUNTER — Other Ambulatory Visit (INDEPENDENT_AMBULATORY_CARE_PROVIDER_SITE_OTHER): Payer: Self-pay | Admitting: Hematology & Oncology

## 2014-02-07 DIAGNOSIS — D693 Immune thrombocytopenic purpura: Secondary | ICD-10-CM

## 2014-02-14 ENCOUNTER — Other Ambulatory Visit (INDEPENDENT_AMBULATORY_CARE_PROVIDER_SITE_OTHER): Payer: Self-pay | Admitting: Hematology & Oncology

## 2014-02-14 DIAGNOSIS — D693 Immune thrombocytopenic purpura: Secondary | ICD-10-CM

## 2014-02-21 ENCOUNTER — Other Ambulatory Visit (INDEPENDENT_AMBULATORY_CARE_PROVIDER_SITE_OTHER): Payer: Self-pay | Admitting: Hematology & Oncology

## 2014-02-21 DIAGNOSIS — D693 Immune thrombocytopenic purpura: Secondary | ICD-10-CM

## 2014-02-28 ENCOUNTER — Other Ambulatory Visit (INDEPENDENT_AMBULATORY_CARE_PROVIDER_SITE_OTHER): Payer: Self-pay | Admitting: Hematology & Oncology

## 2014-02-28 DIAGNOSIS — D693 Immune thrombocytopenic purpura: Secondary | ICD-10-CM

## 2014-03-07 ENCOUNTER — Other Ambulatory Visit (INDEPENDENT_AMBULATORY_CARE_PROVIDER_SITE_OTHER): Payer: Self-pay | Admitting: Hematology & Oncology

## 2014-03-07 DIAGNOSIS — D693 Immune thrombocytopenic purpura: Secondary | ICD-10-CM

## 2014-03-14 ENCOUNTER — Other Ambulatory Visit (INDEPENDENT_AMBULATORY_CARE_PROVIDER_SITE_OTHER): Payer: Self-pay | Admitting: Hematology & Oncology

## 2014-03-14 DIAGNOSIS — D693 Immune thrombocytopenic purpura: Secondary | ICD-10-CM

## 2014-03-21 ENCOUNTER — Other Ambulatory Visit (INDEPENDENT_AMBULATORY_CARE_PROVIDER_SITE_OTHER): Payer: Self-pay | Admitting: Hematology & Oncology

## 2014-03-21 DIAGNOSIS — D693 Immune thrombocytopenic purpura: Secondary | ICD-10-CM

## 2014-03-28 ENCOUNTER — Other Ambulatory Visit (INDEPENDENT_AMBULATORY_CARE_PROVIDER_SITE_OTHER): Payer: Self-pay | Admitting: Hematology & Oncology

## 2014-03-28 DIAGNOSIS — D693 Immune thrombocytopenic purpura: Secondary | ICD-10-CM

## 2014-04-04 ENCOUNTER — Other Ambulatory Visit (INDEPENDENT_AMBULATORY_CARE_PROVIDER_SITE_OTHER): Payer: Self-pay | Admitting: Hematology & Oncology

## 2014-04-04 DIAGNOSIS — D693 Immune thrombocytopenic purpura: Secondary | ICD-10-CM

## 2014-04-11 ENCOUNTER — Other Ambulatory Visit (INDEPENDENT_AMBULATORY_CARE_PROVIDER_SITE_OTHER): Payer: Self-pay | Admitting: Hematology & Oncology

## 2014-04-11 DIAGNOSIS — D693 Immune thrombocytopenic purpura: Secondary | ICD-10-CM

## 2014-04-18 ENCOUNTER — Other Ambulatory Visit (INDEPENDENT_AMBULATORY_CARE_PROVIDER_SITE_OTHER): Payer: Self-pay | Admitting: Hematology & Oncology

## 2014-04-18 DIAGNOSIS — D693 Immune thrombocytopenic purpura: Secondary | ICD-10-CM

## 2014-04-25 ENCOUNTER — Other Ambulatory Visit (INDEPENDENT_AMBULATORY_CARE_PROVIDER_SITE_OTHER): Payer: Self-pay | Admitting: Hematology & Oncology

## 2014-04-25 DIAGNOSIS — D693 Immune thrombocytopenic purpura: Secondary | ICD-10-CM

## 2014-05-02 ENCOUNTER — Other Ambulatory Visit (INDEPENDENT_AMBULATORY_CARE_PROVIDER_SITE_OTHER): Payer: Self-pay | Admitting: Hematology & Oncology

## 2014-05-02 DIAGNOSIS — D693 Immune thrombocytopenic purpura: Secondary | ICD-10-CM

## 2014-05-09 ENCOUNTER — Other Ambulatory Visit (INDEPENDENT_AMBULATORY_CARE_PROVIDER_SITE_OTHER): Payer: Self-pay | Admitting: Hematology & Oncology

## 2014-05-09 DIAGNOSIS — D693 Immune thrombocytopenic purpura: Secondary | ICD-10-CM

## 2014-05-16 ENCOUNTER — Other Ambulatory Visit (INDEPENDENT_AMBULATORY_CARE_PROVIDER_SITE_OTHER): Payer: Self-pay | Admitting: Hematology & Oncology

## 2014-05-16 DIAGNOSIS — D693 Immune thrombocytopenic purpura: Secondary | ICD-10-CM

## 2014-05-23 ENCOUNTER — Other Ambulatory Visit (INDEPENDENT_AMBULATORY_CARE_PROVIDER_SITE_OTHER): Payer: Self-pay | Admitting: Hematology & Oncology

## 2014-05-23 DIAGNOSIS — D693 Immune thrombocytopenic purpura: Secondary | ICD-10-CM
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
